# Patient Record
Sex: Male | Born: 1990 | Hispanic: No | Marital: Single | Smoking: Never smoker
Health system: Southern US, Community
[De-identification: ages and names within clinical notes are randomized; demographics above are authoritative.]

---

## 2014-09-24 ENCOUNTER — Encounter (HOSPITAL_COMMUNITY): Payer: Self-pay | Admitting: Nurse Practitioner

## 2014-09-24 ENCOUNTER — Emergency Department (HOSPITAL_COMMUNITY)

## 2014-09-24 ENCOUNTER — Emergency Department (HOSPITAL_COMMUNITY)
Admission: EM | Admit: 2014-09-24 | Discharge: 2014-09-24 | Disposition: A | Discharge status: Home / Self Care | Attending: Emergency Medicine | Admitting: Emergency Medicine

## 2014-09-24 DIAGNOSIS — R002 Palpitations: Secondary | ICD-10-CM | POA: Insufficient documentation

## 2014-09-24 DIAGNOSIS — R079 Chest pain, unspecified: Secondary | ICD-10-CM | POA: Diagnosis present

## 2014-09-24 LAB — CBC
HCT: 46 % (ref 39.0–52.0)
Hemoglobin: 15.7 g/dL (ref 13.0–17.0)
MCH: 31 pg (ref 26.0–34.0)
MCHC: 34.1 g/dL (ref 30.0–36.0)
MCV: 90.9 fL (ref 78.0–100.0)
PLATELETS: 182 10*3/uL (ref 150–400)
RBC: 5.06 MIL/uL (ref 4.22–5.81)
RDW: 12.5 % (ref 11.5–15.5)
WBC: 3.8 10*3/uL — ABNORMAL LOW (ref 4.0–10.5)

## 2014-09-24 LAB — MAGNESIUM: Magnesium: 2.2 mg/dL (ref 1.5–2.5)

## 2014-09-24 LAB — I-STAT TROPONIN, ED: Troponin i, poc: 0.01 ng/mL (ref 0.00–0.08)

## 2014-09-24 LAB — TSH: TSH: 1.88 u[IU]/mL (ref 0.350–4.500)

## 2014-09-24 LAB — BASIC METABOLIC PANEL
Anion gap: 11 (ref 5–15)
BUN: 12 mg/dL (ref 6–23)
CALCIUM: 9.1 mg/dL (ref 8.4–10.5)
CO2: 28 mEq/L (ref 19–32)
Chloride: 100 mEq/L (ref 96–112)
Creatinine, Ser: 0.96 mg/dL (ref 0.50–1.35)
GFR calc Af Amer: >90 mL/min (ref >90)
Glucose, Bld: 97 mg/dL (ref 70–99)
Potassium: 4.1 mEq/L (ref 3.7–5.3)
Sodium: 139 mEq/L (ref 137–147)

## 2014-09-24 NOTE — ED Notes (Signed)
He states he woke with a fluttering sensation in the L side of his chest. He denies pain. States the fluttering has persisted throughout the day. He is A&Ox4, resp e/u

## 2014-09-24 NOTE — ED Provider Notes (Signed)
23 y.o. Male complaining of palpitations intermittently today.  No pain, dyspnea, cough, or fever.  No significant past medical history.  Denies smoking, drug use, caffeine use.  Filed Vitals:   09/24/14 1302 09/24/14 1348 09/24/14 1349 09/24/14 1353  BP: 128/67 132/83    Pulse: 62     Temp: 97.8 F (36.6 C)     TempSrc: Oral     Resp: 18  15   SpO2: 100%   100%    EKG Interpretation  Date/Time:  Sunday September 24 2014 12:58:14 EST Ventricular Rate:  57 PR Interval:  148 QRS Duration: 90 QT Interval:  418 QTC Calculation: 406 R Axis:   87 Text Interpretation:  Sinus bradycardia Early repolarization Otherwise normal ECG Confirmed by Katalina Magri MD, Duwayne HeckANIELLE [phone]([id number]) on 09/24/2014 2:40:21 PM       I performed a history and physical examination of Jay Pena and discussed his management with Dr. Modesto CharonWong.  I agree with the history, physical, assessment, and plan of care, with the following exceptions: None  I was present for the following procedures: None Time Spent in Critical Care of the patient: None Time spent in discussions with the patient and family: 10  Jillann Charette Corlis LeakS     Neytiri Asche S Penne Rosenstock, MD 09/24/14 1440

## 2014-09-24 NOTE — ED Provider Notes (Signed)
CSN: [id number]     Arrival date & time 09/24/14  1252 History   First MD Initiated Contact with Patient 09/24/14 1343     Chief Complaint  Patient presents with  . Chest Pain     (Consider location/radiation/quality/duration/timing/severity/associated sxs/prior Treatment) Patient is a 23 y.o. male presenting with palpitations. The history is provided by the patient. No language interpreter was used.  Palpitations Palpitations quality:  Regular Onset quality:  Sudden Duration:  2 hours Timing:  Constant Progression:  Unchanged Chronicity:  New Context: not anxiety, not caffeine, not illicit drugs, not nicotine and not stimulant use   Relieved by:  Nothing Worsened by:  Nothing tried Ineffective treatments:  None tried Associated symptoms: no chest pain, no cough, no leg pain, no nausea, no near-syncope, no orthopnea, no PND, no shortness of breath, no syncope, no vomiting and no weakness   Risk factors: no diabetes mellitus, no hx of atrial fibrillation, no hyperthyroidism and no stress     History reviewed. No pertinent past medical history. History reviewed. No pertinent past surgical history. History reviewed. No pertinent family history. History  Substance Use Topics  . Smoking status: Never Smoker   . Smokeless tobacco: Not on file  . Alcohol Use: No    Review of Systems  Constitutional: Negative for fever.  HENT: Negative for congestion, rhinorrhea and sore throat.   Respiratory: Negative for cough and shortness of breath.   Cardiovascular: Positive for palpitations. Negative for chest pain, orthopnea, syncope, PND and near-syncope.  Gastrointestinal: Negative for nausea, vomiting, abdominal pain and diarrhea.  Genitourinary: Negative for dysuria and hematuria.  Skin: Negative for rash.  Neurological: Negative for syncope, light-headedness and headaches.  All other systems reviewed and are negative.     Allergies  Review of patient's allergies indicates no  known allergies.  Home Medications   Prior to Admission medications   Not on File   BP 132/83 mmHg  Pulse 62  Temp(Src) 97.8 F (36.6 C) (Oral)  Resp 15  SpO2 100% Physical Exam  Constitutional: He is oriented to person, place, and time. He appears well-developed and well-nourished.  HENT:  Head: Normocephalic and atraumatic.  Right Ear: External ear normal.  Left Ear: External ear normal.  Eyes: EOM are normal.  Neck: Normal range of motion. Neck supple.  Cardiovascular: Normal rate, regular rhythm and intact distal pulses.  Exam reveals no gallop and no friction rub.   No murmur heard. Pulmonary/Chest: Effort normal and breath sounds normal. No respiratory distress. He has no wheezes. He has no rales. He exhibits no tenderness.  Abdominal: Soft. Bowel sounds are normal. He exhibits no distension. There is no tenderness. There is no rebound.  Musculoskeletal: Normal range of motion. He exhibits no edema or tenderness.  Lymphadenopathy:    He has no cervical adenopathy.  Neurological: He is alert and oriented to person, place, and time.  Skin: Skin is warm. No rash noted.  Psychiatric: He has a normal mood and affect. His behavior is normal.  Nursing note and vitals reviewed.   ED Course  Procedures (including critical care time) Labs Review Labs Reviewed  CBC - Abnormal; Notable for the following:    WBC 3.8 (*)    All other components within normal limits  BASIC METABOLIC PANEL  MAGNESIUM  TSH  I-STAT TROPOININ, ED    Imaging Review Dg Chest 2 View  09/24/2014   CLINICAL DATA:  23 year old male with sensation of palpitations earlier this morning which will woke him  from sleep.  EXAM: CHEST  2 VIEW  COMPARISON:  None.  FINDINGS: The lungs are clear and negative for focal airspace consolidation, pulmonary edema or suspicious pulmonary nodule. No pleural effusion or pneumothorax. Cardiac and mediastinal contours are within normal limits. No acute fracture or lytic or  blastic osseous lesions. The visualized upper abdominal bowel gas pattern is unremarkable.  IMPRESSION: No active cardiopulmonary disease.   Electronically Signed   By: Malachy MoanHeath  McCullough M.D.   On: 09/24/2014 16:11     EKG Interpretation   Date/Time:  Sunday September 24 2014 12:58:14 EST Ventricular Rate:  57 PR Interval:  148 QRS Duration: 90 QT Interval:  418 QTC Calculation: 406 R Axis:   87 Text Interpretation:  Sinus bradycardia Early repolarization Otherwise  normal ECG Confirmed by RAY MD, Duwayne HeckANIELLE ([id number]) on 09/24/2014 2:40:21 PM      MDM   Final diagnoses:  None    2:01 PM Pt is a 23 y.o. male with no pertinent PMHX who presents to the ED with palpitations. Awoke this morning around 11AM with regular palpitations that felt like a vibration and discomfort regularly every 4-5 beats. No alcohol use. No illicit drug abuse or stimulant abuse. No recent illness. No fevers. No nausea, vomiting or diarrhea. No similar symptoms. No chest pain or shortness of breath. Not a smoker. No stressors  On exam: well appearing, no irregular heart beats. No skipped beats. Normal distal pulses in all extremities. Plan for CBC, BMP, Mag, TSH, CXR  EKG personally reviewed by myself showed sinus bradycardia, early repolarization Rate of 57, PR 148ms, QRS 90ms QT/QTC 418/47306ms, normal axis, without evidence of new ischemia. No Comparison, indication: palpitations  CXR PA/LAT per my read for palpitations showed no cardiomegaly. No ptx  Review of labs: CBC: no leukocytosis, H&H 15.7/46.0 BMP: no electrolyte abnormalities istat troponin: 0.01 Mag: 2.2 TSH: 1.880  Plan for discharge. No electrolyte abnormalities on labs. No evidence of PVCs or PACs or abnormal rhythms on monitor. No palpitations while in the ED. Plan for discharge with follow up with PCP for referral to cardiology for holter monitor. Patient does not abuse caffeine, energy drinks or illicit drugs. Strict return precautions  given  4:33 PM:  I have discussed the diagnosis/risks/treatment options with the patient and believe the pt to be eligible for discharge home to follow-up with PCP, cardiology for holter. We also discussed returning to the ED immediately if new or worsening sx occur. We discussed the sx which are most concerning (e.g., worsening symptoms) that necessitate immediate return. Any new prescriptions provided to the patient are listed below.   New Prescriptions   No medications on file    The patient appears reasonably screened and/or stabilized for discharge and I doubt any other medical condition or other [facility] requiring further screening, evaluation or treatment in the ED at this time prior to discharge . Pt in agreement with discharge plan. Return precautions given. Pt discharged VSS   Labs, EKG and imaging reviewed by myself and considered in medical decision making if ordered.  Imaging interpreted by radiology. Pt was discussed with my attending, Dr. Juline Patchay     Shamere Dilworth Peter Eliya Bubar, MD 09/24/14 [id number]  Hilario Quarryanielle S Ray, MD 09/25/14 1055

## 2014-09-24 NOTE — Discharge Instructions (Signed)
1. Come back if worsening symptoms or passing out 2. See your regular doctor or call to get follow up with cardiology to get a holter monitor Holter Monitoring A Holter monitor is a small device with electrodes (small sticky patches) that attach to your chest. It records the electrical activity of your heart and is worn continuously for 24-48 hours.  A HOLTER MONITOR IS USED TO  Detect heart problems such as:  Heart arrhythmia. Is an abnormal or irregular heartbeat. With some heart arrhythmias, you may not feel or know that you have an irregular heart rhythm.  Palpitations, such as feeling your heart racing or fluttering. It is possible to have heart palpitations and not have a heart arrhythmia.  A heart rhythm that is too slow or too fast.  If you have problems fainting, near fainting or feeling light-headed, a Holter monitor may be worn to see if your heart is the cause. HOLTER MONITOR PREPARATION   Electrodes will be attached to the skin on your chest.  If you have hair on your chest, small areas may have to be shaved. This is done to help the patches stick better and make the recording more accurate.  The electrodes are attached by wires to the Holter monitor. The Holter monitor clips to your clothing. You will wear the monitor at all times, even while exercising and sleeping. HOME CARE INSTRUCTIONS   Wear your monitor at all times.  The wires and the monitor must stay dry. Do not get the monitor wet.  Do not bathe, swim or use a hot tub with it on.  You may do a "sponge" bath while you have the monitor on.  Keep your skin clean, do not put body lotion or moisturizer on your chest.  It's possible that your skin under the electrodes could become irritated. To keep this from happening, you may put the electrodes in slightly different places on your chest.  Your caregiver will also ask you to keep a diary of your activities, such as walking or doing chores. Be sure to note what  you are doing if you experience heart symptoms such as palpitations. This will help your caregiver determine what might be contributing to your symptoms. The information stored in your monitor will be reviewed by your caregiver alongside your diary entries.  Make sure the monitor is safely clipped to your clothing or in a location close to your body that your caregiver recommends.  The monitor and electrodes are removed when the test is over. Return the monitor as directed.  Be sure to follow up with your caregiver and discuss your Holter monitor results. SEEK IMMEDIATE MEDICAL CARE IF:  You faint or feel lightheaded.  You have trouble breathing.  You get pain in your chest, upper arm or jaw.  You feel sick to your stomach and your skin is pale, cool, or damp.  You think something is wrong with the way your heart is beating. MAKE SURE YOU:   Understand these instructions.  Will watch your condition.  Will get help right away if you are not doing well or get worse. Document Released: 07/25/2004 Document Revised: 01/19/2012 Document Reviewed: 12/07/2008 [facility] Patient Information 2015 [place], [place]. This information is not intended to replace advice given to you by your health care provider. Make sure you discuss any questions you have with your health care provider.  Palpitations A palpitation is the feeling that your heartbeat is irregular. It may feel like your heart is fluttering or  skipping a beat. It may also feel like your heart is beating faster than normal. This is usually not a serious problem. In some cases, you may need more medical tests. HOME CARE  Avoid:  Caffeine in coffee, tea, soft drinks, diet pills, and energy drinks.  Chocolate.  Alcohol.  Stop smoking if you smoke.  Reduce your stress and anxiety. Try:  A method that measures bodily functions so you can learn to control them (biofeedback).  Yoga.  Meditation.  Physical activity such as  swimming, jogging, or walking.  Get plenty of rest and sleep. GET HELP IF:  Your fast or irregular heartbeat continues after 24 hours.  Your palpitations occur more often. GET HELP RIGHT AWAY IF:   You have chest pain.  You feel short of breath.  You have a very bad headache.  You feel dizzy or pass out (faint). MAKE SURE YOU:   Understand these instructions.  Will watch your condition.  Will get help right away if you are not doing well or get worse. Document Released: 08/05/2008 Document Revised: 03/13/2014 Document Reviewed: 12/26/2011 [facility] Patient Information 2015 [place], [place]. This information is not intended to replace advice given to you by your health care provider. Make sure you discuss any questions you have with your health care provider.

## 2016-03-05 IMAGING — CR DG CHEST 2V
2 series · 2 of 2 positions shown · non-contrast
Comparison: None.

CLINICAL DATA: 23-year-old male with sensation of palpitations
earlier this morning which will woke him from sleep.

EXAM:
CHEST  2 VIEW

[w chest pa]
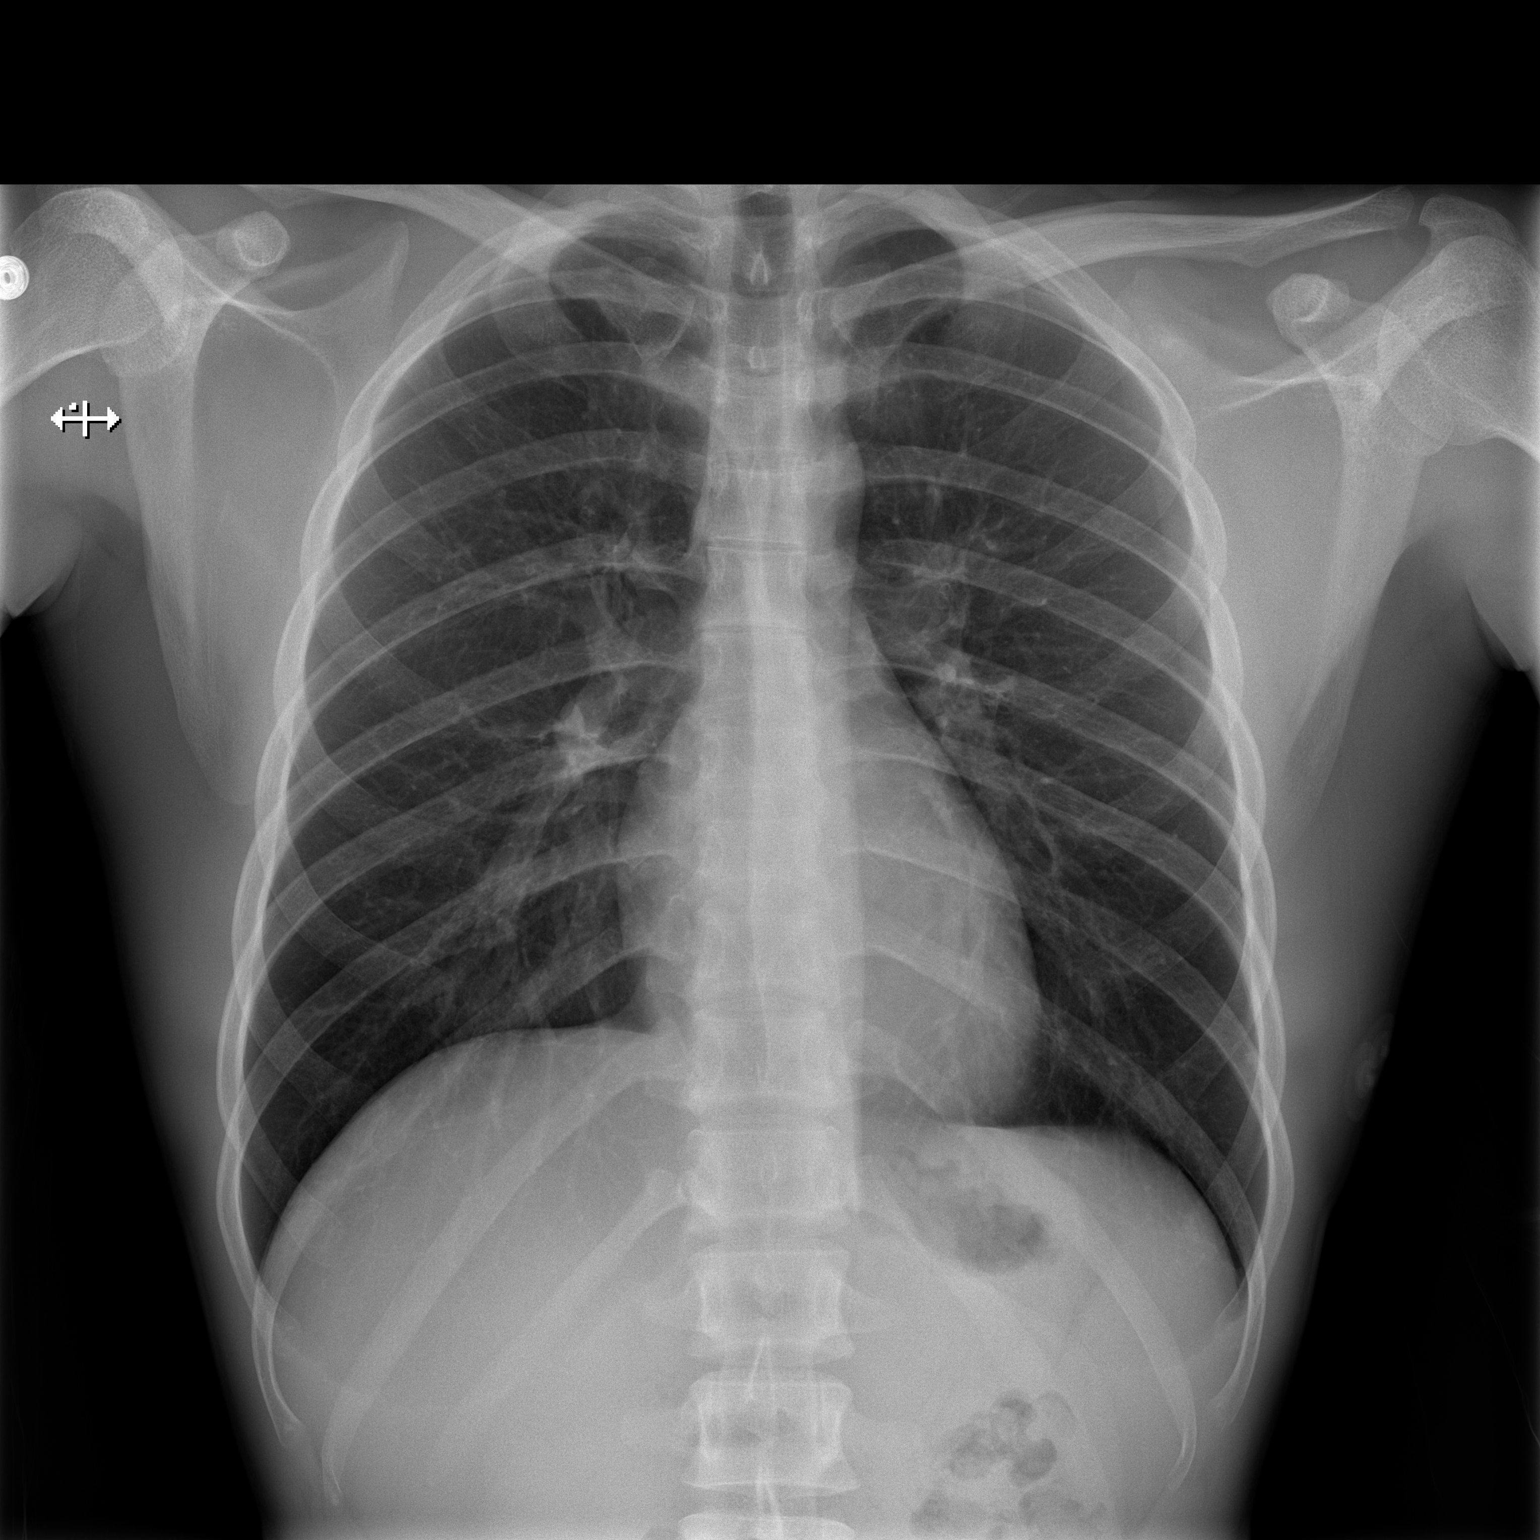

[w chest lat]
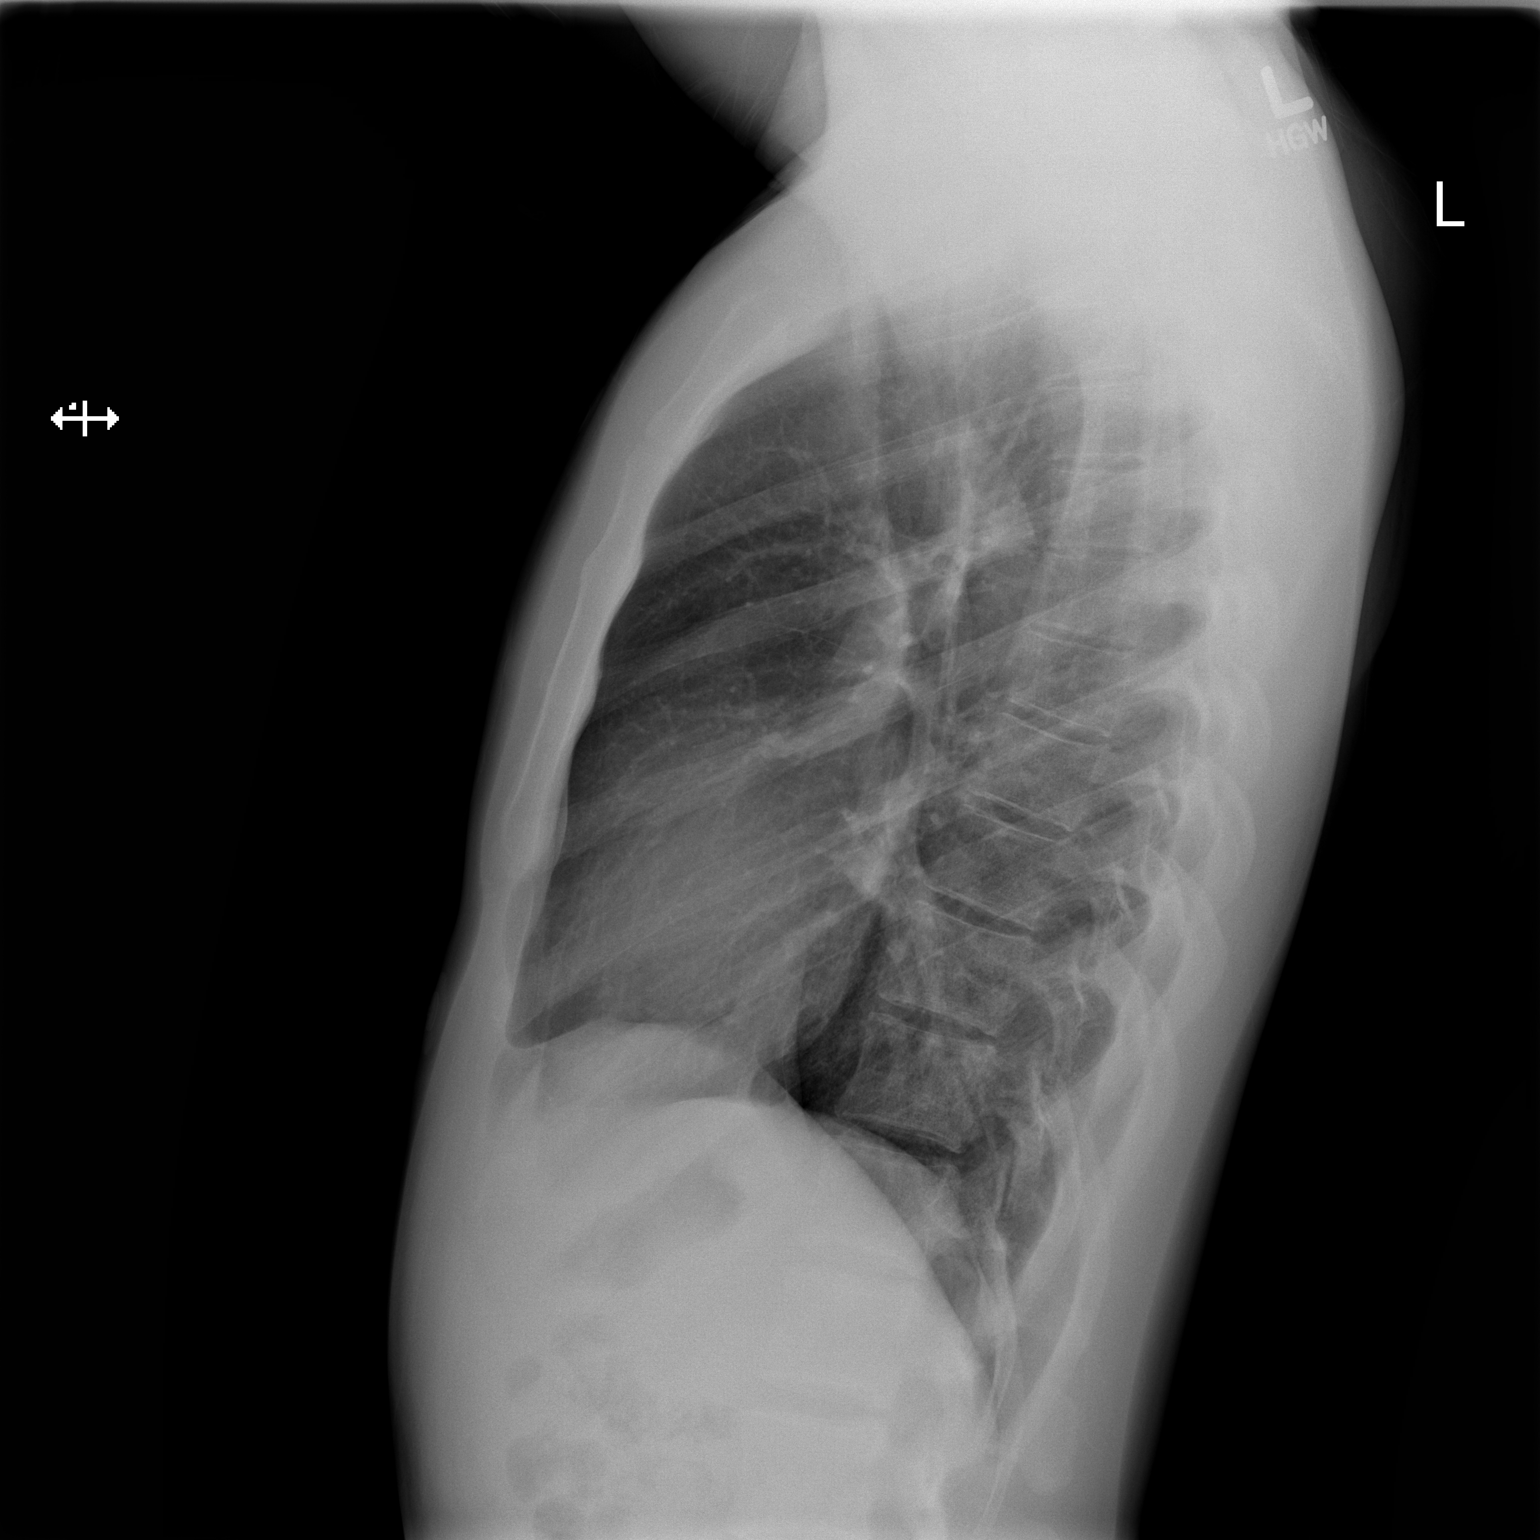

[2 of 2 positions shown; findings below may reference images not displayed]

FINDINGS: The lungs are clear and negative for focal airspace consolidation,
pulmonary edema or suspicious pulmonary nodule. No pleural effusion
or pneumothorax. Cardiac and mediastinal contours are within normal
limits. No acute fracture or lytic or blastic osseous lesions. The
visualized upper abdominal bowel gas pattern is unremarkable.
IMPRESSION: No active cardiopulmonary disease.

## 2019-10-10 ENCOUNTER — Other Ambulatory Visit: Payer: Self-pay

## 2019-10-10 DIAGNOSIS — Z20822 Contact with and (suspected) exposure to covid-19: Secondary | ICD-10-CM

## 2019-10-11 LAB — NOVEL CORONAVIRUS, NAA: SARS-CoV-2, NAA: NOT DETECTED

## 2019-11-23 ENCOUNTER — Ambulatory Visit: Attending: Internal Medicine

## 2019-11-23 DIAGNOSIS — Z20822 Contact with and (suspected) exposure to covid-19: Secondary | ICD-10-CM

## 2019-11-25 LAB — NOVEL CORONAVIRUS, NAA: SARS-CoV-2, NAA: NOT DETECTED

## 2019-12-01 ENCOUNTER — Ambulatory Visit: Attending: Internal Medicine

## 2019-12-01 DIAGNOSIS — Z20822 Contact with and (suspected) exposure to covid-19: Secondary | ICD-10-CM

## 2019-12-02 ENCOUNTER — Other Ambulatory Visit

## 2019-12-02 LAB — NOVEL CORONAVIRUS, NAA: SARS-CoV-2, NAA: DETECTED — AB

## 2019-12-09 ENCOUNTER — Inpatient Hospital Stay (HOSPITAL_COMMUNITY)
Admission: EM | Admit: 2019-12-09 | Discharge: 2019-12-13 | DRG: 177 | Disposition: A | Discharge status: Home / Self Care | Attending: Student in an Organized Health Care Education/Training Program | Admitting: Student in an Organized Health Care Education/Training Program

## 2019-12-09 ENCOUNTER — Other Ambulatory Visit: Payer: Self-pay

## 2019-12-09 ENCOUNTER — Emergency Department (HOSPITAL_COMMUNITY)

## 2019-12-09 ENCOUNTER — Encounter (HOSPITAL_COMMUNITY): Payer: Self-pay | Admitting: Internal Medicine

## 2019-12-09 DIAGNOSIS — Z23 Encounter for immunization: Secondary | ICD-10-CM | POA: Diagnosis not present

## 2019-12-09 DIAGNOSIS — J9601 Acute respiratory failure with hypoxia: Secondary | ICD-10-CM | POA: Diagnosis present

## 2019-12-09 DIAGNOSIS — J9600 Acute respiratory failure, unspecified whether with hypoxia or hypercapnia: Secondary | ICD-10-CM

## 2019-12-09 DIAGNOSIS — F329 Major depressive disorder, single episode, unspecified: Secondary | ICD-10-CM | POA: Diagnosis not present

## 2019-12-09 DIAGNOSIS — R066 Hiccough: Secondary | ICD-10-CM | POA: Diagnosis not present

## 2019-12-09 DIAGNOSIS — U071 COVID-19: Secondary | ICD-10-CM | POA: Diagnosis present

## 2019-12-09 DIAGNOSIS — D72810 Lymphocytopenia: Secondary | ICD-10-CM | POA: Diagnosis present

## 2019-12-09 DIAGNOSIS — D6959 Other secondary thrombocytopenia: Secondary | ICD-10-CM | POA: Diagnosis present

## 2019-12-09 DIAGNOSIS — J1282 Pneumonia due to coronavirus disease 2019: Secondary | ICD-10-CM | POA: Diagnosis present

## 2019-12-09 DIAGNOSIS — D696 Thrombocytopenia, unspecified: Secondary | ICD-10-CM

## 2019-12-09 LAB — COMPREHENSIVE METABOLIC PANEL
ALT: 38 U/L (ref 0–44)
AST: 47 U/L — ABNORMAL HIGH (ref 15–41)
Albumin: 3.5 g/dL (ref 3.5–5.0)
Alkaline Phosphatase: 35 U/L — ABNORMAL LOW (ref 38–126)
Anion gap: 13 (ref 5–15)
BUN: 10 mg/dL (ref 6–20)
CO2: 30 mmol/L (ref 22–32)
Calcium: 8 mg/dL — ABNORMAL LOW (ref 8.9–10.3)
Chloride: 94 mmol/L — ABNORMAL LOW (ref 98–111)
Creatinine, Ser: 1.11 mg/dL (ref 0.61–1.24)
GFR calc Af Amer: >60 mL/min (ref >60)
GFR calc non Af Amer: >60 mL/min (ref >60)
Glucose, Bld: 96 mg/dL (ref 70–99)
Potassium: 4 mmol/L (ref 3.5–5.1)
Sodium: 137 mmol/L (ref 135–145)
Total Bilirubin: 0.7 mg/dL (ref 0.3–1.2)
Total Protein: 6.4 g/dL — ABNORMAL LOW (ref 6.5–8.1)

## 2019-12-09 LAB — TRIGLYCERIDES: Triglycerides: 110 mg/dL (ref <150)

## 2019-12-09 LAB — CBC WITH DIFFERENTIAL/PLATELET
Abs Immature Granulocytes: 0.01 10*3/uL (ref 0.00–0.07)
Basophils Absolute: 0 10*3/uL (ref 0.0–0.1)
Basophils Relative: 0 %
Eosinophils Absolute: 0 10*3/uL (ref 0.0–0.5)
Eosinophils Relative: 0 %
HCT: 46.4 % (ref 39.0–52.0)
Hemoglobin: 15.4 g/dL (ref 13.0–17.0)
Immature Granulocytes: 0 %
Lymphocytes Relative: 14 %
Lymphs Abs: 0.4 10*3/uL — ABNORMAL LOW (ref 0.7–4.0)
MCH: 29.8 pg (ref 26.0–34.0)
MCHC: 33.2 g/dL (ref 30.0–36.0)
MCV: 89.9 fL (ref 80.0–100.0)
Monocytes Absolute: 0.2 10*3/uL (ref 0.1–1.0)
Monocytes Relative: 8 %
Neutro Abs: 2.2 10*3/uL (ref 1.7–7.7)
Neutrophils Relative %: 78 %
Platelets: 123 10*3/uL — ABNORMAL LOW (ref 150–400)
RBC: 5.16 MIL/uL (ref 4.22–5.81)
RDW: 11.1 % — ABNORMAL LOW (ref 11.5–15.5)
WBC: 2.8 10*3/uL — ABNORMAL LOW (ref 4.0–10.5)
nRBC: 0 % (ref 0.0–0.2)

## 2019-12-09 LAB — PROCALCITONIN: Procalcitonin: 0.21 ng/mL

## 2019-12-09 LAB — LACTATE DEHYDROGENASE: LDH: 326 U/L — ABNORMAL HIGH (ref 98–192)

## 2019-12-09 LAB — FIBRINOGEN: Fibrinogen: 547 mg/dL — ABNORMAL HIGH (ref 210–475)

## 2019-12-09 LAB — D-DIMER, QUANTITATIVE: D-Dimer, Quant: 0.58 ug/mL-FEU — ABNORMAL HIGH (ref 0.00–0.50)

## 2019-12-09 LAB — C-REACTIVE PROTEIN: CRP: 7.8 mg/dL — ABNORMAL HIGH (ref <1.0)

## 2019-12-09 LAB — LACTIC ACID, PLASMA: Lactic Acid, Venous: 1.1 mmol/L (ref 0.5–1.9)

## 2019-12-09 LAB — FERRITIN: Ferritin: 928 ng/mL — ABNORMAL HIGH (ref 24–336)

## 2019-12-09 MED ORDER — ACETAMINOPHEN 325 MG PO TABS: 650.0000 mg | ORAL_TABLET | Freq: Four times a day (QID) | ORAL | Status: DC | PRN

## 2019-12-09 MED ORDER — DEXAMETHASONE 6 MG PO TABS
6.0000 mg | ORAL_TABLET | ORAL | Status: DC
  Administered 2019-12-10 – 2019-12-13 (×4): 6 mg via ORAL
  Filled 2019-12-09 (×4): qty 1

## 2019-12-09 MED ORDER — IBUPROFEN 800 MG PO TABS
800.0000 mg | ORAL_TABLET | Freq: Once | ORAL | Status: AC
  Administered 2019-12-09: 15:00:00 800 mg via ORAL
  Filled 2019-12-09: qty 1

## 2019-12-09 MED ORDER — AEROCHAMBER PLUS FLO-VU LARGE MISC: 1.0000 | Freq: Once | Status: DC

## 2019-12-09 MED ORDER — DEXAMETHASONE 4 MG PO TABS: 6.0000 mg | ORAL_TABLET | ORAL | Status: DC

## 2019-12-09 MED ORDER — DEXAMETHASONE SODIUM PHOSPHATE 10 MG/ML IJ SOLN
10.0000 mg | Freq: Once | INTRAMUSCULAR | Status: AC
  Administered 2019-12-09: 10 mg via INTRAVENOUS
  Filled 2019-12-09: qty 1

## 2019-12-09 MED ORDER — LACTATED RINGERS IV BOLUS
1000.0000 mL | Freq: Once | INTRAVENOUS | Status: AC
  Administered 2019-12-09 – 2019-12-10 (×2): 1000 mL via INTRAVENOUS

## 2019-12-09 MED ORDER — ONDANSETRON HCL 4 MG/2ML IJ SOLN: 4.0000 mg | Freq: Four times a day (QID) | INTRAMUSCULAR | Status: DC | PRN

## 2019-12-09 MED ORDER — ONDANSETRON HCL 4 MG PO TABS: 4.0000 mg | ORAL_TABLET | Freq: Four times a day (QID) | ORAL | Status: DC | PRN

## 2019-12-09 MED ORDER — ALBUTEROL SULFATE HFA 108 (90 BASE) MCG/ACT IN AERS
2.0000 | INHALATION_SPRAY | Freq: Once | RESPIRATORY_TRACT | Status: AC
  Administered 2019-12-09: 2 via RESPIRATORY_TRACT
  Filled 2019-12-09: qty 6.7

## 2019-12-09 MED ORDER — ENOXAPARIN SODIUM 40 MG/0.4ML ~~LOC~~ SOLN
40.0000 mg | SUBCUTANEOUS | Status: DC
  Administered 2019-12-09 – 2019-12-12 (×4): 40 mg via SUBCUTANEOUS
  Filled 2019-12-09 (×4): qty 0.4

## 2019-12-09 MED ORDER — POLYETHYLENE GLYCOL 3350 17 G PO PACK: 17.0000 g | PACK | Freq: Every day | ORAL | Status: DC | PRN

## 2019-12-09 NOTE — ED Provider Notes (Signed)
3:40 PM Care assumed from Dr. Hyacinth Meeker.  At time of transfer of care, patient is awaiting admission for hypoxia with Covid and respiratory symptoms.  After labs have returned, anticipate admission.  Labs returned and patient will be admitted. I assessed patient and he agrees with admission. He is feeling much better and is having no chest pain and shortness of breath has improved with the oxygen.  We will admitted for hypoxia with Covid.  Clinical Impression: 1. Acute respiratory failure due to COVID-19 [facility])     Disposition: Admit  This note was prepared with assistance of Dragon voice recognition software. Occasional wrong-word or sound-a-like substitutions may have occurred due to the inherent limitations of voice recognition software.     Loomis Anacker, Canary Brim, MD 12/09/19 2059

## 2019-12-09 NOTE — ED Notes (Signed)
ED Provider at bedside. 

## 2019-12-09 NOTE — ED Notes (Signed)
Notified EDP that hr is trending down, but that pt is resting.

## 2019-12-09 NOTE — H&P (Addendum)
Date: 12/09/2019               Patient Name:  Jay Pena MRN: [id number]  DOB: Jan 10, 1991 Age / Sex: 29 y.o., male   PCP: Patient, No Pcp Per         Medical Service: Internal Medicine Teaching Service         Attending Physician: Dr. Gust Rung, DO    First Contact: Dr. Verdene Lennert Pager: [phone]  Second Contact: Dr. Lenward Chancellor Pager: [phone]       After Hours (After 5p/  First Contact Pager: [phone]  weekends / holidays): Second Contact Pager: [phone]   Chief Complaint: Shortness of breath  History of Present Illness:  Mr.Majerus is a 29 yo M w/ no significant medical history who is presenting with dyspnea. He was observed resting in ED bed. Initially somnolent at first but upon awakening is AAOx3. He states he is in the [employer] and he was notified that he had sick contact with someone who is COVID positive on 11/19/19. He mentions getting his COVID test completed 4 days after exposure and was tested negative (11/23/19). However about 9 days ago(11/30/19) he began to endorse fevers, chills, myalgia, malaise and tested again and was found to be positive. He tried to treat with supportive care at home with Tylenol but his fevers (measured at home in 105F) continued and about 3 days ago began to have worsening dyspnea with wheezing so he came to the ED for evaluation.  On review of systems, he denies any nausea, vomiting, diarrhea, constipation, light-headedness, blurry vision, chest pain, palpitations or change in taste or smell  In the ED, he was found to have new oxygen requirement of 2Ls and multifocal pneumonia on chest X-ray with elevated inflammatory markers. IMTS was consulted for admission.  Meds: Current Meds  Medication Sig   acetaminophen (TYLENOL) 325 MG tablet Take 650 mg by mouth every 6 (six) hours as needed for moderate pain or fever.   Phenyleph-CPM-DM-APAP (ALKA-SELTZER PLUS COLD & FLU PO) Take 1 Dose by mouth as needed (cold and flu).    Allergies: Allergies as of 12/09/2019   (No Known Allergies)   No past medical history on file.  Family History:  No significant family history of early cardiac death, lung disease or cancers  Social History: Lives with mother and girlfriend. Currently in the [employer]. Denies any alcohol, smoking, illicit substance use  Review of Systems: A complete ROS was negative except as per HPI.   Physical Exam: Blood pressure 115/65, pulse (!) 53, temperature 98 F (36.7 C), temperature source Oral, resp. rate 11, SpO2 99 %.  Gen: Well-developed, well nourished, NAD HEENT: NCAT head, EOMI, Dry mucous membranes Neck: supple, ROM intact CV: RRR, S1, S2 normal, No rubs, no murmurs, no gallops Pulm: Bilateral crackles on lower lobes, no wheezing Abd: Soft, BS+, NTND, No rebound, no guarding Extm: ROM intact, Peripheral pulses intact, No peripheral edema Skin: Dry, Warm, normal turgor Neuro: AAOx3  EKG: personally reviewed my interpretation is normal sinus, normal axis, new T-wave inversion on lead 3 only. No ST changes  CXR: personally reviewed my interpretation is bilateral multifocal opacities most prominent in RLL  Assessment & Plan by Problem: Active Problems:   COVID-19  Jay Pena is a 29 yo M presenting with dyspnea due to COVID penumonia  Acute hypoxic respiratory failure 2/2 COVID pneumonia Admit w/ desat to 91. Currently on 2L. Afebrile in ED but febrile at home w/ temp  up to 101F. 9 days since symptom onset. X-ray w/ multifocal opacities. Elevated inflammatory markers: (D-dimer 0.58, LDH 326, Ferritin 928, CRP 7.8). Pro-calcitonin 0.21. Considering time course, likely outside window for benefit with remdesivir. Will treat w/ steroid therapy  and supportive care. - LR bolus - O2 prn to keep sat >88 - Dexamethasone 6mg  daily - Airborne/Contact pre-cautions  Thrombocytopenia Lymphopenia Wbc 2.8, Platelets 123. Both are known sequelae of COVID infection - Trend cbc   Dispo: Admit patient to Inpatient with expected length of stay greater than 2 midnights.  Signed: Mosetta Anis, MD 12/09/2019, 9:26 PM  Pager: [phone]

## 2019-12-09 NOTE — ED Notes (Signed)
Admitting provider at bedside.

## 2019-12-09 NOTE — Care Management (Signed)
ED CM reviewed patient's record for t    ransitional care planning .

## 2019-12-09 NOTE — ED Provider Notes (Signed)
[facility] EMERGENCY DEPARTMENT Provider Note   CSN: [id number] Arrival date & time: 12/09/19  1353     History Chief Complaint  Patient presents with  . Shortness of Breath    Jay Pena is a 29 y.o. male.  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  This was repeated 1 week later, he states that since that time he was tested positive and has known about this test for approximately 1 week.  Over that time he has had fevers and body aches which was replaced by coughing and shortness of breath starting 3 or 4 days ago.  He still feels hot, cannot get his fever down with Tylenol, feels like he is becoming immune to Tylenol, denies any nausea or vomiting but is had a couple episodes of diarrhea.  He is coughing frequently, feels like he cannot take a deep breath and is becoming dyspneic on exertion.  He has no other significant medical history, denies any swelling of the legs, denies any other risk factors for pulmonary embolism or coronary disease.  No past medical history on file.  There are no problems to display for this patient.   No past surgical history on file.     No family history on file.  Social History   Tobacco Use  . Smoking status: Never Smoker  Substance Use Topics  . Alcohol use: No  . Drug use: No    Home Medications Prior to Admission medications   Not on File    Allergies    Patient has no known allergies.  Review of Systems   Review of Systems  All other systems reviewed and are negative.   Physical Exam Updated Vital Signs BP 98/65 (BP Location: Right Arm)   Pulse 96   Temp 99.4 F (37.4 C) (Oral)   Resp 16   SpO2 91%   Physical Exam Vitals and nursing note reviewed.    Constitutional:      General: He is not in acute distress.    Appearance: He is well-developed.     Comments: Uncomfortable appearing  HENT:     Head: Normocephalic and atraumatic.     Mouth/Throat:     Mouth: Mucous membranes are moist.     Pharynx: Oropharynx is clear. No oropharyngeal exudate.  Eyes:     General: No scleral icterus.       Right eye: No discharge.        Left eye: No discharge.     Conjunctiva/sclera: Conjunctivae normal.     Pupils: Pupils are equal, round, and reactive to light.  Neck:     Thyroid: No thyromegaly.     Vascular: No JVD.  Cardiovascular:     Rate and Rhythm: Regular rhythm. Tachycardia present.     Heart sounds: Normal heart sounds. No murmur. No friction rub. No gallop.      Comments: Heart rate approximately 100 bpm, normal pulses at the radial arteries, no JVD or peripheral edema Pulmonary:     Effort: Pulmonary effort is normal. No respiratory distress.     Breath sounds: Normal breath sounds. No wheezing or rales.     Comments: Coughs when he tries to take deep breaths  but clear lungs without wheezing rales or rhonchi Abdominal:     General: Bowel sounds are normal. There is no distension.     Palpations: Abdomen is soft. There is no mass.     Tenderness: There is no abdominal tenderness.     Comments: Soft nontender abdomen  Musculoskeletal:        General: No tenderness. Normal range of motion.     Cervical back: Normal range of motion and neck supple.     Right lower leg: No edema.     Left lower leg: No edema.  Lymphadenopathy:     Cervical: No cervical adenopathy.  Skin:    General: Skin is warm and dry.     Findings: No erythema or rash.  Neurological:     General: No focal deficit present.     Mental Status: He is alert.     Coordination: Coordination normal.  Psychiatric:        Behavior: Behavior normal.     ED Results / Procedures / Treatments   Labs (all labs ordered are listed, but only abnormal results are  displayed) Labs Reviewed  CULTURE, BLOOD (ROUTINE X 2)  CULTURE, BLOOD (ROUTINE X 2)  LACTIC ACID, PLASMA  LACTIC ACID, PLASMA  CBC WITH DIFFERENTIAL/PLATELET  COMPREHENSIVE METABOLIC PANEL  D-DIMER, QUANTITATIVE (NOT AT [facility])  PROCALCITONIN  LACTATE DEHYDROGENASE  FERRITIN  TRIGLYCERIDES  FIBRINOGEN  C-REACTIVE PROTEIN    EKG EKG Interpretation  Date/Time:  Friday December 09 2019 15:16:22 EST Ventricular Rate:  89 PR Interval:    QRS Duration: 82 QT Interval:  361 QTC Calculation: 440 R Axis:   70 Text Interpretation: Sinus rhythm Since last tracing twave flattening now seen Confirmed by Eber Hong ([id number]) on 12/09/2019 3:19:25 PM   Radiology DG Chest Port 1 View  Result Date: 12/09/2019 CLINICAL DATA:  COVID-19 positive. Shortness of breath and chest pain EXAM: PORTABLE CHEST 1 VIEW COMPARISON:  September 24, 2014 FINDINGS: There is airspace opacity in both lower lung regions as well as in the right upper lobe and left mid lung regions. Heart size and pulmonary vascularity are normal. No adenopathy. No bone lesions. IMPRESSION: Multifocal pneumonia, most notable in the lower lung regions but also fairly prominent in the right perihilar region. Cardiac silhouette normal. No adenopathy. Electronically Signed   By: Bretta Bang III M.D.   On: 12/09/2019 14:51    Procedures .Critical Care Performed by: Eber Hong, MD Authorized by: Eber Hong, MD   Critical care provider statement:    Critical care time (minutes):  35   Critical care time was exclusive of:  Separately billable procedures and treating other patients and teaching time   Critical care was necessary to treat or prevent imminent or life-threatening deterioration of the following conditions:  Respiratory failure   Critical care was time spent personally by me on the following activities:  Blood draw for specimens, development of treatment plan with patient or surrogate, discussions with consultants,  evaluation of patient's response to treatment, examination of patient, obtaining history from patient or surrogate, ordering and performing treatments and interventions, ordering and review of laboratory studies, ordering and review of radiographic studies, pulse oximetry, re-evaluation of patient's condition and review of old charts Comments:         (including critical care time)  Medications Ordered in ED Medications  AeroChamber Plus Flo-Vu Large MISC 1 each (1 each Other Not Given 12/09/19 1513)  ibuprofen (ADVIL) tablet 800 mg (800 mg Oral Given 12/09/19  1511)  albuterol (VENTOLIN HFA) 108 (90 Base) MCG/ACT inhaler 2 puff (2 puffs Inhalation Given 12/09/19 1511)  dexamethasone (DECADRON) injection 10 mg (10 mg Intravenous Given 12/09/19 1510)    ED Course  I have reviewed the triage vital signs and the nursing notes.  Pertinent labs & imaging results that were available during my care of the patient were reviewed by me and considered in my medical decision making (see chart for details).    MDM Rules/Calculators/A&P                      The patient has symptoms of Covid, his oxygen is 90% to 91% on room air, he is tachypneic and appears uncomfortable.  He will need a chest x-ray labs and possible admission for oxygen Decadron and remdesivir, the patient is agreeable to the work-up.  He appears to have acute hypoxic respiratory failure secondary coronavirus pneumonia  3:18 PM Cardiac monitoring reveals normal sinus rhythm (Rate & rhythm), as reviewed and interpreted by me. Cardiac monitoring was ordered due to chest pain and shortness of breath and to monitor patient for dysrhythmia.  The patient is critically ill, will start with albuterol, steroids, will need to be admitted, he has multifocal pneumonia on my evaluation and personal interpretation of the x-ray.  Labs pending, at change of shift - care signed out to Dr. Sherry Ruffing to follow up results and disposition  accordingly  Jay Pena was evaluated in Emergency Department on 12/09/2019 for the symptoms described in the history of present illness. He was evaluated in the context of the global COVID-19 pandemic, which necessitated consideration that the patient might be at risk for infection with the SARS-CoV-2 virus that causes COVID-19. Institutional protocols and algorithms that pertain to the evaluation of patients at risk for COVID-19 are in a state of rapid change based on information released by regulatory bodies including the CDC and federal and state organizations. These policies and algorithms were followed during the patient's care in the ED.   Final Clinical Impression(s) / ED Diagnoses Final diagnoses:  Acute respiratory failure due to COVID-19 [facility])    Rx / DC Orders ED Discharge Orders    None       Noemi Chapel, MD 12/09/19 1924

## 2019-12-09 NOTE — ED Triage Notes (Signed)
Pt was diagnosed with covid 19 1 week and now having shortness of breath with restlessness. Pt has room air sats 91% and labored. Pt very warm to touch.

## 2019-12-10 ENCOUNTER — Encounter (HOSPITAL_COMMUNITY): Payer: Self-pay | Admitting: Internal Medicine

## 2019-12-10 DIAGNOSIS — J9601 Acute respiratory failure with hypoxia: Secondary | ICD-10-CM | POA: Diagnosis present

## 2019-12-10 LAB — CBC WITH DIFFERENTIAL/PLATELET
Abs Immature Granulocytes: 0.01 10*3/uL (ref 0.00–0.07)
Basophils Absolute: 0 10*3/uL (ref 0.0–0.1)
Basophils Relative: 0 %
Eosinophils Absolute: 0 10*3/uL (ref 0.0–0.5)
Eosinophils Relative: 0 %
HCT: 47.8 % (ref 39.0–52.0)
Hemoglobin: 16.2 g/dL (ref 13.0–17.0)
Immature Granulocytes: 1 %
Lymphocytes Relative: 13 %
Lymphs Abs: 0.3 10*3/uL — ABNORMAL LOW (ref 0.7–4.0)
MCH: 29.9 pg (ref 26.0–34.0)
MCHC: 33.9 g/dL (ref 30.0–36.0)
MCV: 88.2 fL (ref 80.0–100.0)
Monocytes Absolute: 0.1 10*3/uL (ref 0.1–1.0)
Monocytes Relative: 6 %
Neutro Abs: 1.6 10*3/uL — ABNORMAL LOW (ref 1.7–7.7)
Neutrophils Relative %: 80 %
Platelets: 137 10*3/uL — ABNORMAL LOW (ref 150–400)
RBC: 5.42 MIL/uL (ref 4.22–5.81)
RDW: 11.3 % — ABNORMAL LOW (ref 11.5–15.5)
WBC: 2 10*3/uL — ABNORMAL LOW (ref 4.0–10.5)
nRBC: 0 % (ref 0.0–0.2)

## 2019-12-10 LAB — COMPREHENSIVE METABOLIC PANEL
ALT: 42 U/L (ref 0–44)
AST: 46 U/L — ABNORMAL HIGH (ref 15–41)
Albumin: 3.2 g/dL — ABNORMAL LOW (ref 3.5–5.0)
Alkaline Phosphatase: 36 U/L — ABNORMAL LOW (ref 38–126)
Anion gap: 10 (ref 5–15)
BUN: 13 mg/dL (ref 6–20)
CO2: 29 mmol/L (ref 22–32)
Calcium: 8.3 mg/dL — ABNORMAL LOW (ref 8.9–10.3)
Chloride: 99 mmol/L (ref 98–111)
Creatinine, Ser: 0.94 mg/dL (ref 0.61–1.24)
GFR calc Af Amer: >60 mL/min (ref >60)
GFR calc non Af Amer: >60 mL/min (ref >60)
Glucose, Bld: 139 mg/dL — ABNORMAL HIGH (ref 70–99)
Potassium: 4.8 mmol/L (ref 3.5–5.1)
Sodium: 138 mmol/L (ref 135–145)
Total Bilirubin: 0.7 mg/dL (ref 0.3–1.2)
Total Protein: 6.3 g/dL — ABNORMAL LOW (ref 6.5–8.1)

## 2019-12-10 LAB — C-REACTIVE PROTEIN: CRP: 9.4 mg/dL — ABNORMAL HIGH (ref <1.0)

## 2019-12-10 LAB — HIV ANTIBODY (ROUTINE TESTING W REFLEX): HIV Screen 4th Generation wRfx: NONREACTIVE

## 2019-12-10 LAB — D-DIMER, QUANTITATIVE: D-Dimer, Quant: 0.41 ug/mL-FEU (ref 0.00–0.50)

## 2019-12-10 MED ORDER — HYDROCOD POLST-CPM POLST ER 10-8 MG/5ML PO SUER
5.0000 mL | Freq: Two times a day (BID) | ORAL | Status: DC | PRN
  Administered 2019-12-10 – 2019-12-12 (×2): 5 mL via ORAL
  Filled 2019-12-10 (×2): qty 5

## 2019-12-10 MED ORDER — SODIUM CHLORIDE 0.9 % IV SOLN
100.0000 mg | Freq: Every day | INTRAVENOUS | Status: DC
  Administered 2019-12-11 – 2019-12-12 (×2): 100 mg via INTRAVENOUS
  Filled 2019-12-10 (×2): qty 20

## 2019-12-10 MED ORDER — SODIUM CHLORIDE 0.9 % IV SOLN
200.0000 mg | Freq: Once | INTRAVENOUS | Status: AC
  Administered 2019-12-10: 13:00:00 200 mg via INTRAVENOUS
  Filled 2019-12-10: qty 200

## 2019-12-10 MED ORDER — INFLUENZA VAC SPLIT QUAD 0.5 ML IM SUSY
0.5000 mL | PREFILLED_SYRINGE | INTRAMUSCULAR | Status: AC
  Administered 2019-12-11: 0.5 mL via INTRAMUSCULAR
  Filled 2019-12-10: qty 0.5

## 2019-12-10 NOTE — Plan of Care (Signed)
Pt admitted onto the unit from the ED. Alert and oriented x4. Skin assessment completed by two nurses. Oriented to room, offered and completed shower. Denies SOB or pain. Resting quietly at this time in bed. Will continue to monitor.

## 2019-12-10 NOTE — Progress Notes (Signed)
     Subjective: c/o cough, SOB (supplemental o2 helps), notes more severe dyspnea getting up to go to restroom off o2  Objective:  Vital signs in last 24 hours: Vitals:   12/09/19 2309 12/10/19 0528 12/10/19 0859 12/10/19 0900  BP: (!) 140/117 126/84  125/84  Pulse: 67 62 69   Resp:   20   Temp: 98.9 F (37.2 C) 97.9 F (36.6 C) 98 F (36.7 C)   TempSrc: Oral Oral    SpO2: 94% 98% 97%   Weight: 59.6 kg     Height: 5\' 4"  (1.626 m)     General: resting in bed, appears fatigued, on 1 L supplemental o2 HEENT:EOMI, no scleral icterus Cardiac: RRR, no rubs, murmurs or gallops Pulm: clear to auscultation bilaterally Abd: soft, nontender, nondistended, BS present Ext: warm and well perfused, no pedal edema   Assessment/Plan: Acute hypoxic respiratory failure 2/2 severe COVID 19 pneumonia - Continue decadron -Start remdesevir 5 day course - Tussionex for cough -Encourage IS use - Encourage self proning   Remain in isolation for 21 days from 12/01/19>> end isolation on 12/23/19 Dispo: Anticipated discharge in approximately 3-4 day(s).  From: Home Anticipated d/c location: Home Barriers: inpatient treatment/hypoxia  02/20/20, DO 12/10/2019, 11:11 AM Pager: [phone]

## 2019-12-11 LAB — COMPREHENSIVE METABOLIC PANEL
ALT: 37 U/L (ref 0–44)
AST: 33 U/L (ref 15–41)
Albumin: 2.9 g/dL — ABNORMAL LOW (ref 3.5–5.0)
Alkaline Phosphatase: 33 U/L — ABNORMAL LOW (ref 38–126)
Anion gap: 10 (ref 5–15)
BUN: 13 mg/dL (ref 6–20)
CO2: 28 mmol/L (ref 22–32)
Calcium: 8.2 mg/dL — ABNORMAL LOW (ref 8.9–10.3)
Chloride: 101 mmol/L (ref 98–111)
Creatinine, Ser: 0.8 mg/dL (ref 0.61–1.24)
GFR calc Af Amer: >60 mL/min (ref >60)
GFR calc non Af Amer: >60 mL/min (ref >60)
Glucose, Bld: 143 mg/dL — ABNORMAL HIGH (ref 70–99)
Potassium: 4.3 mmol/L (ref 3.5–5.1)
Sodium: 139 mmol/L (ref 135–145)
Total Bilirubin: 0.5 mg/dL (ref 0.3–1.2)
Total Protein: 5.7 g/dL — ABNORMAL LOW (ref 6.5–8.1)

## 2019-12-11 LAB — CBC WITH DIFFERENTIAL/PLATELET
Abs Immature Granulocytes: 0.02 10*3/uL (ref 0.00–0.07)
Basophils Absolute: 0 10*3/uL (ref 0.0–0.1)
Basophils Relative: 0 %
Eosinophils Absolute: 0 10*3/uL (ref 0.0–0.5)
Eosinophils Relative: 0 %
HCT: 43.5 % (ref 39.0–52.0)
Hemoglobin: 14.8 g/dL (ref 13.0–17.0)
Immature Granulocytes: 0 %
Lymphocytes Relative: 7 %
Lymphs Abs: 0.4 10*3/uL — ABNORMAL LOW (ref 0.7–4.0)
MCH: 29.8 pg (ref 26.0–34.0)
MCHC: 34 g/dL (ref 30.0–36.0)
MCV: 87.7 fL (ref 80.0–100.0)
Monocytes Absolute: 0.5 10*3/uL (ref 0.1–1.0)
Monocytes Relative: 8 %
Neutro Abs: 5 10*3/uL (ref 1.7–7.7)
Neutrophils Relative %: 85 %
Platelets: 196 10*3/uL (ref 150–400)
RBC: 4.96 MIL/uL (ref 4.22–5.81)
RDW: 11.4 % — ABNORMAL LOW (ref 11.5–15.5)
WBC: 5.9 10*3/uL (ref 4.0–10.5)
nRBC: 0 % (ref 0.0–0.2)

## 2019-12-11 LAB — TROPONIN I (HIGH SENSITIVITY): Troponin I (High Sensitivity): <2 ng/L (ref <18)

## 2019-12-11 LAB — LACTATE DEHYDROGENASE: LDH: 234 U/L — ABNORMAL HIGH (ref 98–192)

## 2019-12-11 LAB — D-DIMER, QUANTITATIVE: D-Dimer, Quant: 0.36 ug/mL-FEU (ref 0.00–0.50)

## 2019-12-11 LAB — C-REACTIVE PROTEIN: CRP: 4.1 mg/dL — ABNORMAL HIGH (ref <1.0)

## 2019-12-11 MED ORDER — BACLOFEN 10 MG PO TABS
5.0000 mg | ORAL_TABLET | Freq: Three times a day (TID) | ORAL | Status: DC | PRN
  Administered 2019-12-12: 5 mg via ORAL
  Filled 2019-12-11: qty 1

## 2019-12-11 NOTE — Progress Notes (Signed)
SATURATION QUALIFICATIONS: Patient Saturations on Room Air at Rest = 97% Patient Saturations on Room Air while Ambulating = 91%.

## 2019-12-11 NOTE — Progress Notes (Signed)
     Subjective: cough improved but still present, reports able to use IS well.  Having episodes of uncontrolled hiccups which is unusual for him Objective:  Vital signs in last 24 hours: Vitals:   12/10/19 1234 12/10/19 2041 12/11/19 0452 12/11/19 1242  BP: 128/77 119/72 118/61 118/69  Pulse: 66 68 (!) 53 (!) 53  Resp: 20 15 14 15   Temp: 98 F (36.7 C) (!) 97.5 F (36.4 C) 98.4 F (36.9 C) 98.1 F (36.7 C)  TempSrc: Oral Oral Oral Oral  SpO2: 95% 91% 96% 95%  Weight:      Height:      General: resting in bed, continues to appear very fatigued but off supplemental o2, O2 sats 93-95% during my interview HEENT:no scleral icterus Cardiac: RRR, no rubs, murmurs or gallops Pulm: crackles left base Abd: soft, nontender, nondistended, BS present Ext: warm and well perfused, no pedal edema   Assessment/Plan: Acute hypoxic respiratory failure 2/2 severe COVID 19 pneumonia - Continue decadron 6mg  daily for 10 days or until hospital discharge - remdesevir 5 day course - Tussionex for cough -Encourage IS use - Encourage self proning -would continue to benefit from inpatient care given ongoing O2 sat <94% at rest  -Plan to ambulate with nursing tomorrow with O2 sat monitoring. inflammatory markers, LDH, thrombocytopenia and lymphopenia all improving.  Hiccups - Baclofen 5mg  TIDPRN hiccups.  Remain in isolation for 21 days from 12/01/19>> end isolation on 12/23/19 Dispo: Anticipated discharge in approximately 3-4 day(s).  From: Home Anticipated d/c location: Home Barriers: inpatient treatment/hypoxia  , DO 12/11/2019, 1:20 PM Pager: [phone]

## 2019-12-11 NOTE — Plan of Care (Signed)

## 2019-12-12 ENCOUNTER — Encounter (HOSPITAL_COMMUNITY): Payer: Self-pay | Admitting: Internal Medicine

## 2019-12-12 DIAGNOSIS — F329 Major depressive disorder, single episode, unspecified: Secondary | ICD-10-CM

## 2019-12-12 LAB — CBC WITH DIFFERENTIAL/PLATELET
Abs Immature Granulocytes: 0.03 10*3/uL (ref 0.00–0.07)
Basophils Absolute: 0 10*3/uL (ref 0.0–0.1)
Basophils Relative: 0 %
Eosinophils Absolute: 0 10*3/uL (ref 0.0–0.5)
Eosinophils Relative: 0 %
HCT: 46.6 % (ref 39.0–52.0)
Hemoglobin: 15.6 g/dL (ref 13.0–17.0)
Immature Granulocytes: 1 %
Lymphocytes Relative: 12 %
Lymphs Abs: 0.5 10*3/uL — ABNORMAL LOW (ref 0.7–4.0)
MCH: 29.9 pg (ref 26.0–34.0)
MCHC: 33.5 g/dL (ref 30.0–36.0)
MCV: 89.4 fL (ref 80.0–100.0)
Monocytes Absolute: 0.4 10*3/uL (ref 0.1–1.0)
Monocytes Relative: 10 %
Neutro Abs: 3.4 10*3/uL (ref 1.7–7.7)
Neutrophils Relative %: 77 %
Platelets: 234 10*3/uL (ref 150–400)
RBC: 5.21 MIL/uL (ref 4.22–5.81)
RDW: 11.4 % — ABNORMAL LOW (ref 11.5–15.5)
Smear Review: ADEQUATE
WBC: 4.5 10*3/uL (ref 4.0–10.5)
nRBC: 0 % (ref 0.0–0.2)

## 2019-12-12 LAB — D-DIMER, QUANTITATIVE: D-Dimer, Quant: 0.28 ug/mL-FEU (ref 0.00–0.50)

## 2019-12-12 LAB — COMPREHENSIVE METABOLIC PANEL
ALT: 40 U/L (ref 0–44)
AST: 31 U/L (ref 15–41)
Albumin: 3 g/dL — ABNORMAL LOW (ref 3.5–5.0)
Alkaline Phosphatase: 35 U/L — ABNORMAL LOW (ref 38–126)
Anion gap: 10 (ref 5–15)
BUN: 18 mg/dL (ref 6–20)
CO2: 28 mmol/L (ref 22–32)
Calcium: 8.4 mg/dL — ABNORMAL LOW (ref 8.9–10.3)
Chloride: 104 mmol/L (ref 98–111)
Creatinine, Ser: 0.7 mg/dL (ref 0.61–1.24)
GFR calc Af Amer: >60 mL/min (ref >60)
GFR calc non Af Amer: >60 mL/min (ref >60)
Glucose, Bld: 142 mg/dL — ABNORMAL HIGH (ref 70–99)
Potassium: 4.3 mmol/L (ref 3.5–5.1)
Sodium: 142 mmol/L (ref 135–145)
Total Bilirubin: 0.8 mg/dL (ref 0.3–1.2)
Total Protein: 6.1 g/dL — ABNORMAL LOW (ref 6.5–8.1)

## 2019-12-12 LAB — C-REACTIVE PROTEIN: CRP: 1.5 mg/dL — ABNORMAL HIGH (ref <1.0)

## 2019-12-12 NOTE — Progress Notes (Addendum)
   Subjective:   Mr. Henneman was seen laying in his bed this morning. He had flat affect and stated that he was feeling down about his diagnosis   Objective:  Vital signs in last 24 hours: Vitals:   12/11/19 0452 12/11/19 1242 12/11/19 2104 12/12/19 0501  BP: 118/61 118/69 113/62 109/61  Pulse: (!) 53 (!) 53 (!) 45 (!) 55  Resp: 14 15 16 16   Temp: 98.4 F (36.9 C) 98.1 F (36.7 C) 97.7 F (36.5 C) 97.6 F (36.4 C)  TempSrc: Oral Oral Oral Oral  SpO2: 96% 95% 95% 96%  Weight:      Height:       Physical Exam  Constitutional: He is oriented to person, place, and time. He appears well-developed and well-nourished. No distress.  HENT:  Head: Normocephalic and atraumatic.  Eyes: Conjunctivae are normal.  Respiratory: Effort normal and breath sounds normal. No respiratory distress. He has no wheezes.  Musculoskeletal:        General: No edema.  Neurological: He is alert and oriented to person, place, and time.  Skin: He is not diaphoretic.  Psychiatric: Thought content normal. His speech is not rapid and/or pressured. He is slowed. He exhibits a depressed mood.   Assessment/Plan:  Principal Problem:   COVID-19 Active Problems:   Acute respiratory failure with hypoxia (HCC)  Acute hypoxic respiratory failure 2/2 covid 19 pneumonia  Patient was breathing on room air although he was connected to [place]. Having severe dyspnea with exertion. Good airflow without rales, rhonchi, or wheezing.   -continue decadron 6mg  qd x 10 days -Stop remdesivir today as patient feels it is causing intractable hiccups. Unclear to me if this is an actual side effect. -continue tussionex 50ml bid prn  -continue zofran 4mg  q6hrs  Dispo: Anticipated discharge in approximately 1-2 day depending on functional status. Wean oxygen requirements please.  , MD 12/12/2019, 1:47 PM

## 2019-12-13 LAB — COMPREHENSIVE METABOLIC PANEL
ALT: 52 U/L — ABNORMAL HIGH (ref 0–44)
AST: 39 U/L (ref 15–41)
Albumin: 3 g/dL — ABNORMAL LOW (ref 3.5–5.0)
Alkaline Phosphatase: 33 U/L — ABNORMAL LOW (ref 38–126)
Anion gap: 9 (ref 5–15)
BUN: 14 mg/dL (ref 6–20)
CO2: 27 mmol/L (ref 22–32)
Calcium: 8.1 mg/dL — ABNORMAL LOW (ref 8.9–10.3)
Chloride: 105 mmol/L (ref 98–111)
Creatinine, Ser: 0.68 mg/dL (ref 0.61–1.24)
GFR calc Af Amer: >60 mL/min (ref >60)
GFR calc non Af Amer: >60 mL/min (ref >60)
Glucose, Bld: 121 mg/dL — ABNORMAL HIGH (ref 70–99)
Potassium: 4 mmol/L (ref 3.5–5.1)
Sodium: 141 mmol/L (ref 135–145)
Total Bilirubin: 0.9 mg/dL (ref 0.3–1.2)
Total Protein: 6.1 g/dL — ABNORMAL LOW (ref 6.5–8.1)

## 2019-12-13 LAB — CBC WITH DIFFERENTIAL/PLATELET
Abs Immature Granulocytes: 0.03 10*3/uL (ref 0.00–0.07)
Basophils Absolute: 0 10*3/uL (ref 0.0–0.1)
Basophils Relative: 0 %
Eosinophils Absolute: 0 10*3/uL (ref 0.0–0.5)
Eosinophils Relative: 0 %
HCT: 44.6 % (ref 39.0–52.0)
Hemoglobin: 14.9 g/dL (ref 13.0–17.0)
Immature Granulocytes: 1 %
Lymphocytes Relative: 12 %
Lymphs Abs: 0.5 10*3/uL — ABNORMAL LOW (ref 0.7–4.0)
MCH: 30 pg (ref 26.0–34.0)
MCHC: 33.4 g/dL (ref 30.0–36.0)
MCV: 89.7 fL (ref 80.0–100.0)
Monocytes Absolute: 0.4 10*3/uL (ref 0.1–1.0)
Monocytes Relative: 9 %
Neutro Abs: 3.1 10*3/uL (ref 1.7–7.7)
Neutrophils Relative %: 78 %
Platelets: 274 10*3/uL (ref 150–400)
RBC: 4.97 MIL/uL (ref 4.22–5.81)
RDW: 11.5 % (ref 11.5–15.5)
WBC: 4.1 10*3/uL (ref 4.0–10.5)
nRBC: 0 % (ref 0.0–0.2)

## 2019-12-13 LAB — C-REACTIVE PROTEIN: CRP: 0.9 mg/dL (ref <1.0)

## 2019-12-13 LAB — D-DIMER, QUANTITATIVE: D-Dimer, Quant: 0.33 ug/mL-FEU (ref 0.00–0.50)

## 2019-12-13 MED ORDER — DEXAMETHASONE 6 MG PO TABS: 6.0000 mg | ORAL_TABLET | ORAL | 0 refills | Status: AC

## 2019-12-13 NOTE — Progress Notes (Signed)
  Subjective:  Patient seen at bedside. Patient states that he feels a lot better. Patient states he has walked around the room and his shortness of breath has improved. Patient states he feels ready to go home. Patient counseled on the duration of further isolation needed.   Objective:    Vital Signs (last 24 hours): Vitals:   12/11/19 2104 12/12/19 0501 12/12/19 2120 12/13/19 0520  BP: 113/62 109/61 111/69 135/78  Pulse: (!) 45 (!) 55 (!) 50 66  Resp: 16 16 16 18   Temp: 97.7 F (36.5 C) 97.6 F (36.4 C) (!) 97.2 F (36.2 C) 98 F (36.7 C)  TempSrc: Oral Oral Oral Oral  SpO2: 95% 96% 96% 97%  Weight:      Height:        Physical Exam: General Alert and answers questions appropriately, no acute distress  Pulmonary Clear to auscultation bilaterally without wheezes, rhonchi, or rales  Neuro Strength grossly in tact bilaterally    Assessment/Plan:   Principal Problem:   COVID-19 Active Problems:   Acute respiratory failure with hypoxia [facility])  Patient is a 29 year old male with no significant past medical history who presented on 12/09/19 with dyspnea due to COVID-19 pneumonia.  # Acute hypoxic respiratory failure 2/2 COVID-19 pneumonia: Patient was breathing comfortably on room air and has had improvement in his shortness of breath symptoms. Lungs were clear to auscultation. Remdesevir was stopped yesterday due to intractable hiccups and symptomatic improvement.  * Patient will be discharged to complete course of decadron.  Diet: Regular DVT Ppx: Lovenox 40 mg daily Dispo: Anticipated discharge today  12/11/19, MD 12/13/2019, 1:26 PM Pager: [phone]

## 2019-12-13 NOTE — Care Management (Signed)
Pt deemed stable for discharge.  CM reviewed chart for TOC needs- none determined.  Discharge order written - CM signing off

## 2019-12-13 NOTE — Progress Notes (Signed)
Jay Pena to be D/C'd Home per MD order.  Discussed with the patient and all questions fully answered.  VSS, Skin clean, dry and intact without evidence of skin break down, no evidence of skin tears noted. IV catheter discontinued intact. Site without signs and symptoms of complications. Dressing and pressure applied.  An After Visit Summary was printed and given to the patient. Patient received prescription.  D/c education completed with patient including follow up instructions, medication list, d/c activities limitations if indicated, with other d/c instructions as indicated by MD - patient able to verbalize understanding, all questions fully answered.   Patient instructed to return to ED, call 911, or call MD for any changes in condition.   Patient escorted via WC, and D/C home via private auto.  [name] 12/13/2019 12:30 PM

## 2019-12-14 LAB — CULTURE, BLOOD (ROUTINE X 2)
Culture: NO GROWTH
Culture: NO GROWTH
Special Requests: ADEQUATE
Special Requests: ADEQUATE

## 2019-12-14 NOTE — Discharge Summary (Signed)
Name: Jay Pena MRN: [id number] DOB: [birth year] 29 y.o. PCP: Patient, No Pcp Per  Date of Admission: 12/09/2019  1:57 PM Date of Discharge: 12/13/2019 Attending Physician: Lalla Brothers, MD FACP  Discharge Diagnosis: 1. Acute hypoxic respiratory failure 2/2 COVID-19 pneumonia  Discharge Medications: Allergies as of 12/13/2019   No Known Allergies     Medication List    TAKE these medications   acetaminophen 325 MG tablet Commonly known as: TYLENOL Take 650 mg by mouth every 6 (six) hours as needed for moderate pain or fever.   ALKA-SELTZER PLUS COLD & FLU PO Take 1 Dose by mouth as needed (cold and flu).   dexamethasone 6 MG tablet Commonly known as: DECADRON Take 1 tablet (6 mg total) by mouth daily.       Disposition and follow-up:   Mr.Jay Pena was discharged from [facility] in Good condition.  At the hospital follow up visit please address:  1.  Please assess patient for resolution of symptoms.  Patient also had significant anxiety associated with his illness.  Please assess patient for mental health referral.  2.  Labs / imaging needed at time of follow-up: None  3.  Pending labs/ test needing follow-up: None  Follow-up Appointments: Follow-up Information    [place]. Call.   Why: Please call as soon as possible for a followup appointment to take place at the end of next week Contact information: [address] [place] [facility] Course by problem list: # Acute hypoxic respiratory failure 2/2 COVID-19 pneumonia: Patient presented on 12/09/2019 with dyspnea and desaturations to 91% on room air.  He presented 9 days after symptom onset with a positive COVID-19 test on 12/01/2019.  Patient had elevated inflammatory markers and chest x-ray was with multifocal opacities.  Patient received 3 days of remdesivir but this was discontinued as patient attributed his  intractable hiccups to this medication.  Patient was also started on dexamethasone and received 4 days in hospital and was discharged to complete total 7-day course.  On day of discharge, patient was saturating well on room air, had significant improvement in his dyspnea, and was able to ambulate without shortness of breath.  Discharge Vitals:   BP 135/78 (BP Location: Left Arm)   Pulse 66   Temp 98 F (36.7 C) (Oral)   Resp 18   Ht 5\' 4"  (1.626 m)   Wt 59.6 kg   SpO2 97%   BMI 22.55 kg/m   Pertinent Labs, Studies, and Procedures:  CBC Latest Ref Rng & Units 12/13/2019 12/12/2019 12/11/2019  WBC 4.0 - 10.5 K/uL 4.1 4.5 5.9  Hemoglobin 13.0 - 17.0 g/dL 14.9 15.6 14.8  Hematocrit 39.0 - 52.0 % 44.6 46.6 43.5  Platelets 150 - 400 K/uL 274 234 196   BMP Latest Ref Rng & Units 12/13/2019 12/12/2019 12/11/2019  Glucose 70 - 99 mg/dL 121(H) 142(H) 143(H)  BUN 6 - 20 mg/dL 14 18 13   Creatinine 0.61 - 1.24 mg/dL 0.68 0.70 0.80  Sodium 135 - 145 mmol/L 141 142 139  Potassium 3.5 - 5.1 mmol/L 4.0 4.3 4.3  Chloride 98 - 111 mmol/L 105 104 101  CO2 22 - 32 mmol/L 27 28 28   Calcium 8.9 - 10.3 mg/dL 8.1(L) 8.4(L) 8.2(L)   D-dimer: 0.58 (1/29) -> 0.41 -> 0.36 -> 0.28 -> 0.33 (2/2) CRP: 7.8 (1/29) -> 9.4 -> 4.1 -> 1.5 -> 0.9 (  2/2)  Discharge Instructions: Discharge Instructions    Call MD for:  difficulty breathing, headache or visual disturbances   Complete by: As directed    Call MD for:  severe uncontrolled pain   Complete by: As directed    Call MD for:  temperature >100.4   Complete by: As directed    Diet - low sodium heart healthy   Complete by: As directed    Discharge instructions   Complete by: As directed    You were seen in the hospital for COVID-19 infection. You were given medications to help your body fight this infection. We have given you a prescription for 3 more days of dexamethasone which you should take once daily. We have also provided you with the number for the [facility]. Please call for an appointment to take place at the end of next week.   Regarding the need for continued isolation, it has been 12 days since you initially tested positive so you are at low risk of transmitting the virus to someone else. To be cautious, we would recommend you to avoid going to work or traveling outside the home for the next 7 days. After 7 days, you can fully return to life as before, while using standard precautions like mask wearing and social distancing in public. Thank you for allowing [place] to be part of your medical care!   Increase activity slowly   Complete by: As directed       Signed: Katherine Roan, MD 12/14/2019, 7:57 AM

## 2021-05-20 IMAGING — DX DG CHEST 1V PORT
1 series · 1 of 1 positions shown · non-contrast
Comparison: September 24, 2014

CLINICAL DATA: [id number] positive. Shortness of breath and chest
pain

EXAM:
PORTABLE CHEST 1 VIEW

[chest ap]
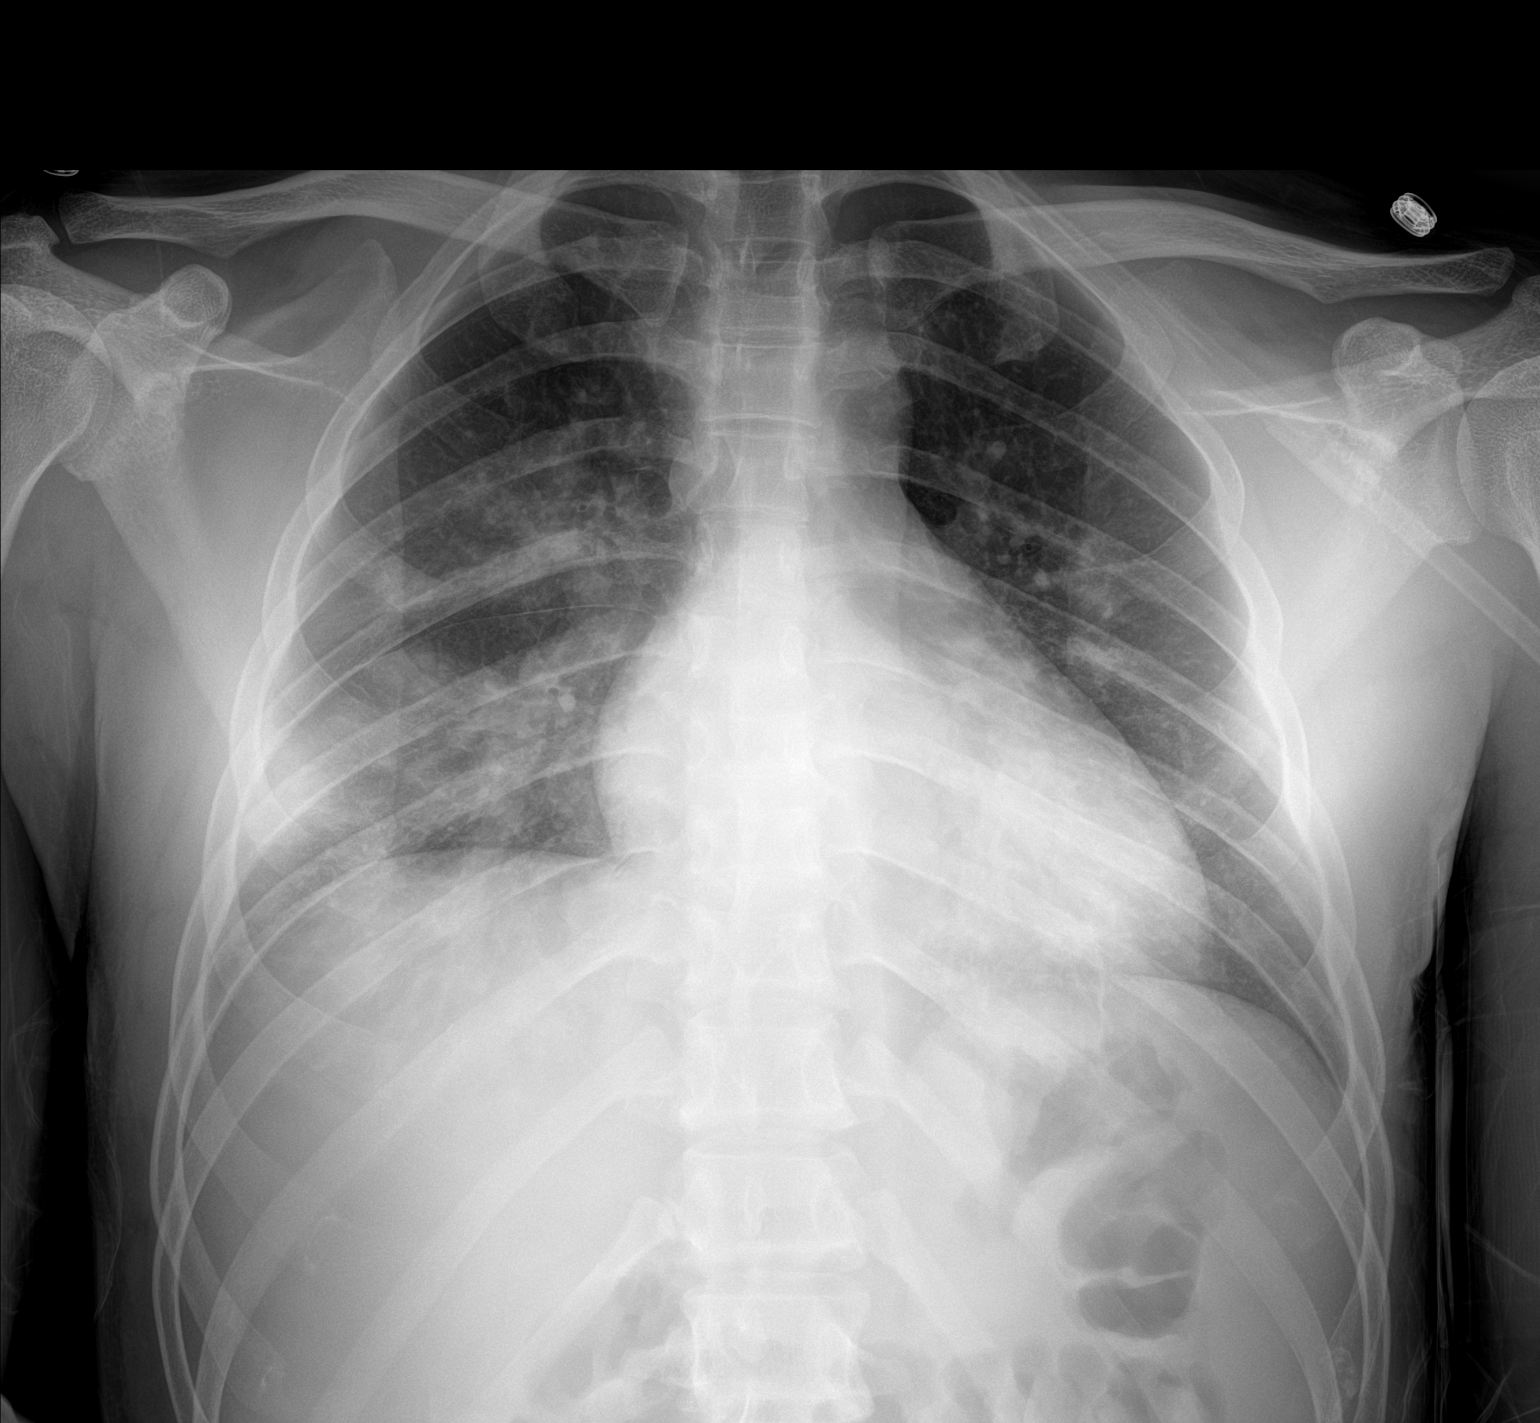

[1 of 1 positions shown; findings below may reference images not displayed]

FINDINGS: There is airspace opacity in both lower lung regions as well as in
the right upper lobe and left mid lung regions. Heart size and
pulmonary vascularity are normal. No adenopathy. No bone lesions.
IMPRESSION: Multifocal pneumonia, most notable in the lower lung regions but
also fairly prominent in the right perihilar region. Cardiac
silhouette normal. No adenopathy.

## 2022-02-02 ENCOUNTER — Other Ambulatory Visit: Payer: Self-pay

## 2022-02-02 ENCOUNTER — Emergency Department (HOSPITAL_COMMUNITY)
Admission: EM | Admit: 2022-02-02 | Discharge: 2022-02-02 | Disposition: A | Discharge status: Home / Self Care | Attending: Emergency Medicine | Admitting: Emergency Medicine

## 2022-02-02 DIAGNOSIS — S0993XA Unspecified injury of face, initial encounter: Secondary | ICD-10-CM | POA: Diagnosis present

## 2022-02-02 DIAGNOSIS — S0181XA Laceration without foreign body of other part of head, initial encounter: Secondary | ICD-10-CM | POA: Insufficient documentation

## 2022-02-02 DIAGNOSIS — W540XXA Bitten by dog, initial encounter: Secondary | ICD-10-CM | POA: Diagnosis not present

## 2022-02-02 DIAGNOSIS — S0185XA Open bite of other part of head, initial encounter: Secondary | ICD-10-CM

## 2022-02-02 MED ORDER — AMOXICILLIN-POT CLAVULANATE 875-125 MG PO TABS: 1.0000 | ORAL_TABLET | Freq: Two times a day (BID) | ORAL | 0 refills | Status: AC

## 2022-02-02 MED ORDER — LIDOCAINE-EPINEPHRINE 2 %-1:100000 IJ SOLN
20.0000 mL | Freq: Once | INTRAMUSCULAR | Status: AC
  Administered 2022-02-02: 20 mL via INTRADERMAL
  Filled 2022-02-02: qty 1

## 2022-02-02 MED ORDER — TETANUS-DIPHTH-ACELL PERTUSSIS 5-2.5-18.5 LF-MCG/0.5 IM SUSY
0.5000 mL | PREFILLED_SYRINGE | Freq: Once | INTRAMUSCULAR | Status: DC
  Filled 2022-02-02: qty 0.5

## 2022-02-02 MED ORDER — LIDOCAINE-EPINEPHRINE-TETRACAINE (LET) TOPICAL GEL
3.0000 mL | Freq: Once | TOPICAL | Status: AC
  Administered 2022-02-02: 3 mL via TOPICAL
  Filled 2022-02-02: qty 3

## 2022-02-02 MED ORDER — LIDOCAINE-EPINEPHRINE 2 %-1:100000 IJ SOLN: 20.0000 mL | Freq: Once | INTRAMUSCULAR | Status: DC

## 2022-02-02 NOTE — ED Provider Triage Note (Signed)
Emergency Medicine Provider Triage Evaluation Note ? ?[name] , a 31 y.o. male  was evaluated in triage.  Pt complains of dog bite to the face/lip by his shitzu. Immunizations of the dog are utd. ? ?Review of Systems  ?Positive: Dog bite ?Negative: fever ? ?Physical Exam  ?BP (!) 155/89 (BP Location: Left Arm)   Pulse 73   Temp 98.1 ?F (36.7 ?C) (Oral)   Resp 18   SpO2 97%  ?Gen:   Awake, no distress   ?Resp:  Normal effort  ?MSK:   Moves extremities without difficulty  ?Other:  Lacerations noted to the right upper and lower lip ? ?Medical Decision Making  ?Medically screening exam initiated at 3:30 PM.  Appropriate orders placed.  Jay Pena was informed that the remainder of the evaluation will be completed by another provider, this initial triage assessment does not replace that evaluation, and the importance of remaining in the ED until their evaluation is complete. ? ? ?  ?Karrie Meres, PA-C ?02/02/22 1531 ? ?

## 2022-02-02 NOTE — ED Provider Notes (Signed)
?[facility]-EMERGENCY DEPT ?Provider Note ? ? ?CSN: [id number] ?Arrival date & time: 02/02/22  1501 ? ?  ? ?History ? ?Chief Complaint  ?Patient presents with  ? Animal Bite  ? ? ?Jay Pena is a 31 y.o. male who presents emergency department with dog bite.  Patient states that he was bitten in the face by his family dog.  The dog is up-to-date on all of his immunizations.  Patient is also up-to-date on his tetanus vaccination last updated in 2018.  He has lacerations to the upper and lower lips.  He denies any other bite wounds or injuries.  No pain with movement of the jaw or loose teeth. ? ? ?Animal Bite ? ?  ? ?Home Medications ?Prior to Admission medications   ?Medication Sig Start Date End Date Taking? Authorizing Provider  ?acetaminophen (TYLENOL) 325 MG tablet Take 650 mg by mouth every 6 (six) hours as needed for moderate pain or fever.    [provider]  ?dexamethasone (DECADRON) 6 MG tablet Take 1 tablet (6 mg total) by mouth daily. 12/14/19   Katherine Roan, MD  ?Phenyleph-CPM-DM-APAP Clarene Reamer PLUS COLD & FLU PO) Take 1 Dose by mouth as needed (cold and flu).    [provider]  ?   ? ?Allergies    ?Patient has no known allergies.   ? ?Review of Systems   ?Review of Systems ? ?Physical Exam ?Updated Vital Signs ?BP (!) 155/89 (BP Location: Left Arm)   Pulse 73   Temp 98.1 ?F (36.7 ?C) (Oral)   Resp 18   SpO2 97%  ?Physical Exam ?Vitals and nursing note reviewed.  ?Constitutional:   ?   General: He is not in acute distress. ?   Appearance: He is well-developed. He is not diaphoretic.  ?HENT:  ?   Head: Normocephalic and atraumatic.  ?   Mouth/Throat:  ?   Comments: Gaping laceration to the right lateral aspect of the upper lip involving the vermilion border, this is not through and through.  Also a small laceration on the medial lower lip involving the vermilion border. ?Eyes:  ?   General: No scleral icterus. ?   Conjunctiva/sclera: Conjunctivae normal.   ?Cardiovascular:  ?   Rate and Rhythm: Normal rate and regular rhythm.  ?   Heart sounds: Normal heart sounds.  ?Pulmonary:  ?   Effort: Pulmonary effort is normal. No respiratory distress.  ?   Breath sounds: Normal breath sounds.  ?Abdominal:  ?   Palpations: Abdomen is soft.  ?   Tenderness: There is no abdominal tenderness.  ?Musculoskeletal:  ?   Cervical back: Normal range of motion and neck supple.  ?Skin: ?   General: Skin is warm and dry.  ?Neurological:  ?   Mental Status: He is alert.  ?Psychiatric:     ?   Behavior: Behavior normal.  ? ? ?ED Results / Procedures / Treatments   ?Labs ?(all labs ordered are listed, but only abnormal results are displayed) ?Labs Reviewed - No data to display ? ?EKG ?None ? ?Radiology ?No results found. ? ?Procedures ?Marland Kitchen.Laceration Repair ? ?Date/Time: 02/02/2022 7:43 PM ?Performed by: Arthor Captain, PA-C ?Authorized by: Arthor Captain, PA-C  ? ?Consent:  ?  Consent obtained:  Verbal ?  Consent given by:  Patient ?  Risks discussed:  Infection, need for additional repair, pain, poor cosmetic result and poor wound healing ?  Alternatives discussed:  No treatment and delayed treatment ?Universal protocol:  ?  Procedure explained and questions answered to patient or proxy's satisfaction: yes   ?  Relevant documents present and verified: yes   ?  Test results available: yes   ?  Imaging studies available: yes   ?  Required blood products, implants, devices, and special equipment available: yes   ?  Site/side marked: yes   ?  Immediately prior to procedure, a time out was called: yes   ?  Patient identity confirmed:  Verbally with patient ?Anesthesia:  ?  Anesthesia method:  Local infiltration ?  Local anesthetic:  Lidocaine 2% WITH epi ?Laceration details:  ?  Location:  Lip ?  Lip location:  Upper exterior lip ?  Length (cm):  3 ?Pre-procedure details:  ?  Preparation:  Patient was prepped and draped in usual sterile fashion ?Exploration:  ?  Wound exploration: wound explored  through full range of motion and entire depth of wound visualized   ?Treatment:  ?  Area cleansed with:  Povidone-iodine ?  Amount of cleaning:  Standard ?  Irrigation solution:  Sterile water ?  Irrigation method:  Pressure wash ?Skin repair:  ?  Repair method:  Sutures ?  Suture size:  6-0 ?  Suture material:  Prolene ?  Suture technique:  Simple interrupted ?  Number of sutures:  3 ?Approximation:  ?  Approximation:  Loose ?  Vermilion border well-aligned: yes   ?Repair type:  ?  Repair type:  Intermediate ?Post-procedure details:  ?  Dressing:  Open (no dressing) ?  Procedure completion:  Tolerated well, no immediate complications ?Marland Kitchen.Laceration Repair ? ?Date/Time: 02/02/2022 7:45 PM ?Performed by: Arthor Captain, PA-C ?Authorized by: Arthor Captain, PA-C  ? ?Consent:  ?  Consent obtained:  Verbal ?  Consent given by:  Patient ?  Risks discussed:  Infection, need for additional repair, pain, poor cosmetic result and poor wound healing ?  Alternatives discussed:  No treatment and delayed treatment ?Universal protocol:  ?  Procedure explained and questions answered to patient or proxy's satisfaction: yes   ?  Relevant documents present and verified: yes   ?  Test results available: yes   ?  Imaging studies available: yes   ?  Required blood products, implants, devices, and special equipment available: yes   ?  Site/side marked: yes   ?  Immediately prior to procedure, a time out was called: yes   ?  Patient identity confirmed:  Verbally with patient ?Anesthesia:  ?  Anesthesia method:  Local infiltration ?  Local anesthetic:  Lidocaine 2% WITH epi ?Laceration details:  ?  Location:  Lip ?  Lip location:  Lower exterior lip ?  Length (cm):  1 ?Exploration:  ?  Wound exploration: wound explored through full range of motion and entire depth of wound visualized   ?Treatment:  ?  Area cleansed with:  Povidone-iodine ?  Amount of cleaning:  Standard ?  Irrigation solution:  Sterile saline ?  Irrigation method:   Pressure wash ?Skin repair:  ?  Repair method:  Sutures ?  Suture size:  6-0 ?  Suture material:  Prolene ?  Suture technique:  Simple interrupted ?  Number of sutures:  2 ?Approximation:  ?  Approximation:  Loose ?  Vermilion border well-aligned: yes    ? ? ?Medications Ordered in ED ?Medications  ?Tdap (BOOSTRIX) injection 0.5 mL (has no administration in time range)  ?lidocaine-EPINEPHrine (XYLOCAINE W/EPI) 2 %-1:100000 (with pres) injection 20 mL (has no administration in time range)  ?  lidocaine-EPINEPHrine (XYLOCAINE W/EPI) 2 %-1:100000 (with pres) injection 20 mL (has no administration in time range)  ?lidocaine-EPINEPHrine-tetracaine (LET) topical gel (3 mLs Topical Given 02/02/22 1604)  ? ? ?ED Course/ Medical Decision Making/ A&P ?  ?                        ?Medical Decision Making ?Risk ?Prescription drug management. ? ?Fortino SicJimmy Laningham is a 31 y.o. male who presents to ED for laceration of upper and lower lip due to Dog bite to the face. Wound thoroughly cleaned in ED today. Wound explored and bottom of wound seen in a bloodless field. Laceration repaired as dictated above. Patient counseled on home wound care. DC with Augmentin. Follow up with PCP/urgent care or return to ER for suture removal in 7 days. Patient was urged to return to the Emergency Department for worsening pain, swelling, expanding erythema especially if it streaks away from the affected area, fever, or for any additional concerns. Patient verbalized understanding. All questions answered. ? ? ?Final Clinical Impression(s) / ED Diagnoses ?Final diagnoses:  ?Dog bite of face, initial encounter  ? ? ?Rx / DC Orders ?ED Discharge Orders   ? ? None  ? ?  ? ? ?  ?Arthor CaptainHarris, Gwendolyn Nishi, PA-C ?02/02/22 1947 ? ?  ?Virgina NorfolkCuratolo, Adam, DO ?02/02/22 2003 ? ?

## 2022-02-02 NOTE — ED Triage Notes (Signed)
Pt comes in having been bitten by family dog about 30 min PTA.  Pt has large laceration to right upper lip and a smaller lac to lower lip on the same side. Dog is up to date on rabies.  Pt states he is in the [employer] and up to date with his tetanus as well.  Bleeding controlled.  ?

## 2022-02-02 NOTE — Discharge Instructions (Addendum)

## 2024-09-30 ENCOUNTER — Other Ambulatory Visit: Payer: Self-pay

## 2024-09-30 ENCOUNTER — Emergency Department (HOSPITAL_COMMUNITY)
Admission: EM | Admit: 2024-09-30 | Discharge: 2024-10-01 | Disposition: A | Discharge status: Home / Self Care | Payer: Self-pay | Attending: Emergency Medicine | Admitting: Emergency Medicine

## 2024-09-30 ENCOUNTER — Encounter (HOSPITAL_COMMUNITY): Payer: Self-pay | Admitting: *Deleted

## 2024-09-30 DIAGNOSIS — W268XXA Contact with other sharp object(s), not elsewhere classified, initial encounter: Secondary | ICD-10-CM | POA: Diagnosis not present

## 2024-09-30 DIAGNOSIS — S61432A Puncture wound without foreign body of left hand, initial encounter: Secondary | ICD-10-CM | POA: Insufficient documentation

## 2024-09-30 NOTE — ED Triage Notes (Signed)
 The laceration occurred 6 hours ago

## 2024-09-30 NOTE — ED Triage Notes (Signed)
 Pt c/o laceration to left palm of his hand while trying to break chopsticks. Bleeding controlled at this time.

## 2024-10-01 NOTE — ED Notes (Signed)
 ED Provider at bedside.

## 2024-10-01 NOTE — Discharge Instructions (Signed)
 Wound is official and does not require formal repair. Continue cleansing at least twice daily, can use topical antibiotic ointment and bandage.   Use caution if cooking or around raw meat/foods, chemicals, etc to avoid contamination into wound. Can follow-up with your doctor if any issues.

## 2024-10-01 NOTE — ED Provider Notes (Signed)
 [place] EMERGENCY DEPARTMENT AT [facility] Provider Note   CSN: [id number] Arrival date & time: 09/30/24  2341     Patient presents with: Laceration   Jay Pena is a 33 y.o. male.   The history is provided by the patient and medical records.  Laceration  33 year old male here with wound to left hand and after breaking chopsticks.  This happened many hours ago but was reading on [employer] and became concerned.  He did cleanse wound thoroughly and applied antibiotic ointment along with bandage.  His tetanus is up-to-date.  Prior to Admission medications   Medication Sig Start Date End Date Taking? Authorizing Provider  acetaminophen  (TYLENOL ) 325 MG tablet Take 650 mg by mouth every 6 (six) hours as needed for moderate pain or fever.    [provider]  dexamethasone  (DECADRON ) 6 MG tablet Take 1 tablet (6 mg total) by mouth daily. 12/14/19   Venancio Cough, MD  Phenyleph-CPM-DM-APAP (ALKA-SELTZER PLUS COLD & FLU PO) Take 1 Dose by mouth as needed (cold and flu).    [provider]    Allergies: Patient has no known allergies.    Review of Systems  Skin:  Positive for wound.  All other systems reviewed and are negative.   Updated Vital Signs BP 130/87   Pulse 76   Temp 98.6 F (37 C)   Resp 18   Ht 5' 4 (1.626 m)   Wt 59.6 kg   SpO2 100%   BMI 22.55 kg/m   Physical Exam Vitals and nursing note reviewed.  Constitutional:      Appearance: He is well-developed.  HENT:     Head: Normocephalic and atraumatic.  Eyes:     Conjunctiva/sclera: Conjunctivae normal.     Pupils: Pupils are equal, round, and reactive to light.  Cardiovascular:     Rate and Rhythm: Normal rate and regular rhythm.     Heart sounds: Normal heart sounds.  Pulmonary:     Effort: Pulmonary effort is normal.  Musculoskeletal:        General: Normal range of motion.     Cervical back: Normal range of motion.     Comments: Left palm with very superficial, 1cm  wound to the palm along the first metacarpal, there is no skin gaping or active bleeding, wound without any noted FB, able to flex/extend all fingers without issue  Skin:    General: Skin is warm and dry.  Neurological:     Mental Status: He is alert and oriented to person, place, and time.     (all labs ordered are listed, but only abnormal results are displayed) Labs Reviewed - No data to display  EKG: None  Radiology: No results found.   Procedures   Medications Ordered in the ED - No data to display                                  Medical Decision Making  34 year old male here with superficial wound to left palm after breaking chopsticks.  There is no skin gaping, active bleeding, or bony deformity.  Wound is seen without any noted foreign body.  Normal range of motion.  Tetanus is up-to-date.  Wound is very superficial and does not require formal repair.  Discussed home wound care instructions.  Can follow-up with PCP.  Return here for new concerns.  Final diagnoses:  Puncture wound of left hand without foreign body,  initial encounter    ED Discharge Orders     None          Jarold Olam HERO, PA-C 10/01/24 0132    Haze Lonni PARAS, MD 10/01/24 [phone]
